# Patient Record
Sex: Male | Born: 1960
Health system: Southern US, Community
[De-identification: ages and names within clinical notes are randomized; demographics above are authoritative.]

## PROBLEM LIST (undated history)

## (undated) DIAGNOSIS — B019 Varicella without complication: Secondary | ICD-10-CM

## (undated) DIAGNOSIS — R011 Cardiac murmur, unspecified: Secondary | ICD-10-CM

## (undated) HISTORY — PX: CARPAL TUNNEL RELEASE: SHX101

## (undated) HISTORY — PX: TONSILLECTOMY: SUR1361

## (undated) HISTORY — DX: Cardiac murmur, unspecified: R01.1

## (undated) HISTORY — DX: Varicella without complication: B01.9

---

## 2012-09-14 ENCOUNTER — Encounter: Payer: Self-pay | Admitting: Family Medicine

## 2012-09-14 ENCOUNTER — Ambulatory Visit (INDEPENDENT_AMBULATORY_CARE_PROVIDER_SITE_OTHER): Payer: BC Managed Care – PPO | Admitting: Family Medicine

## 2012-09-14 VITALS — BP 160/84 | HR 72 | Temp 98.0°F | Resp 12 | Ht 68.0 in | Wt 209.0 lb

## 2012-09-14 DIAGNOSIS — R030 Elevated blood-pressure reading, without diagnosis of hypertension: Secondary | ICD-10-CM

## 2012-09-14 DIAGNOSIS — M542 Cervicalgia: Secondary | ICD-10-CM

## 2012-09-14 DIAGNOSIS — IMO0001 Reserved for inherently not codable concepts without codable children: Secondary | ICD-10-CM

## 2012-09-14 DIAGNOSIS — Z299 Encounter for prophylactic measures, unspecified: Secondary | ICD-10-CM

## 2012-09-14 MED ORDER — HYDROCODONE-ACETAMINOPHEN 5-325 MG PO TABS: ORAL_TABLET | ORAL | Status: DC

## 2012-09-14 MED ORDER — PREDNISONE 10 MG PO TABS: ORAL_TABLET | ORAL | Status: DC

## 2012-09-14 MED ORDER — TETANUS-DIPHTH-ACELL PERTUSSIS 5-2.5-18.5 LF-MCG/0.5 IM SUSP: 0.5000 mL | Freq: Once | INTRAMUSCULAR | Status: DC

## 2012-09-14 NOTE — Progress Notes (Signed)
  Subjective:    Patient ID: Jim Moran, male    DOB: [birth year], 51 y.o.   MRN: [id number]  HPI  Patient new to establish. No chronic medical problem. Reportedly had heart murmur in childhood but none as an adult. He takes no medications. No surgeries. No known drug allergies.  Family history reviewed. Father had leukemia.Also had type 2 diabetes.   Patient is single. Works as an Personnel officer in Occupational hygienist. Quit smoking about 3 years ago. Occasional alcohol use.  Cervical neck pain. 2 and one half week duration. No injury. Started with burning type sensation radiating toward right shoulder blade. Sharp quality. Moderate to severe at times. Aleve without relief. Heat with mild relief. Denies any upper extremity numbness or weakness. No radiculopathy symptoms. Used Flexeril with minimal if any improvement. Denies any cough or dyspnea.   Review of Systems  Constitutional: Negative for fever, chills and unexpected weight change.  HENT: Positive for neck pain and neck stiffness.   Respiratory: Negative for cough and shortness of breath.   Gastrointestinal: Negative for abdominal pain.  Neurological: Negative for weakness and numbness.       Objective:   Physical Exam  Constitutional: He appears well-developed and well-nourished.  Neck: Neck supple. No thyromegaly present.  Cardiovascular: Normal rate and regular rhythm.   Pulmonary/Chest: Effort normal and breath sounds normal. No respiratory distress. He has no wheezes. He has no rales.  Musculoskeletal: He exhibits no edema.       Good distal upper extremity pulses. Cervical neck range of motion is slightly restricted with lateral bending and rotation to the right and left side. No spinal tenderness. Full range of motion both shoulders  Neurological:       No upper extremity weakness. No numbness. Symmetric upper extremity reflexes          Assessment & Plan:  #1 cervical neck pain. Nonfocal neuro exam. Continue heat.  Prednisone taper. Limited hydrocodone for nighttime use for severe pain. Consider imaging in 2-3 weeks if no better #2 elevated blood pressure. No history of hypertension. Monitor for next couple weeks and reassess in 3 weeks time.

## 2012-09-23 ENCOUNTER — Other Ambulatory Visit: Payer: Self-pay | Admitting: Family Medicine

## 2012-09-23 NOTE — Telephone Encounter (Signed)
New pt 10/2, 3 week follow-up scheduled 10/28 10/2 pt placed on prednisone taper.

## 2012-09-23 NOTE — Telephone Encounter (Signed)
May refill once only.  If persist after this go round of prednisone we'll need followup to consider further imaging

## 2012-09-24 NOTE — Telephone Encounter (Signed)
Electronic request for refill of prednisone, reviewing Dr Lucie Leather note, I spoke with pt and he stated "the pain returned as soon as I came off the prednisone".  Pt has F/U visit on 10/28, together we decided to use OTC Aleve or Tylenol rather than another round of the steroid.

## 2012-10-10 ENCOUNTER — Ambulatory Visit: Payer: BC Managed Care – PPO | Admitting: Family Medicine

## 2014-02-05 ENCOUNTER — Telehealth: Payer: Self-pay | Admitting: Family Medicine

## 2014-02-05 NOTE — Telephone Encounter (Signed)
Yes

## 2014-02-05 NOTE — Telephone Encounter (Signed)
Pt would like md to accept his wife as new pt. Can I sch?

## 2014-02-09 NOTE — Telephone Encounter (Signed)
Pt wife will callback on Monday to sch new pt appt

## 2015-01-22 ENCOUNTER — Telehealth: Payer: Self-pay | Admitting: Family Medicine

## 2015-01-22 NOTE — Telephone Encounter (Signed)
Patient Name: Jim Moran  DOB: 03/06/1961    Initial Comment Caller states husband c/o intermittant chest pain, heaviness in chest all the time   Nurse Assessment  Nurse: Sherilyn CooterHenry, RN, Thurmond ButtsWade Date/Time Lamount Cohen(Eastern Time): 01/22/2015 9:02:51 AM  Confirm and document reason for call. If symptomatic, describe symptoms. ---Caller states that her husband has had some intermittent chest pain that began last week. He has a heavy pressure of his chest. He is not with her at present. She is only calling to make an appointment. I advised her that with these symptoms, we recommend calling 911. She states that it is not serious enough for him to call 911. She is only wanting to make an appointment for him to be seen. From the Message of the day info, I advised her that I will forward this information to the office and someone will get back with her. She verbalized understanding.  Has the patient traveled out of the country within the last 30 days? ---Not Applicable  Does the patient require triage? ---No     Guidelines    Guideline Title Affirmed Question Affirmed Notes       Final Disposition User

## 2015-01-22 NOTE — Telephone Encounter (Signed)
Per Dr. Leonard SchwartzB. Pt needs to go to the hospital. Informed patient caller. Patient Caller still wants appointment to be seen. Informed patient caller that the patient needs to go to the hospital and the patient can make appointment after he leaves the hospital. Caller stated that she will see what the patient wants to do.

## 2015-02-01 ENCOUNTER — Ambulatory Visit
Admission: RE | Admit: 2015-02-01 | Discharge: 2015-02-01 | Disposition: A | Discharge status: Home / Self Care | Payer: BLUE CROSS/BLUE SHIELD | Source: Ambulatory Visit | Attending: Family Medicine | Admitting: Family Medicine

## 2015-02-01 ENCOUNTER — Other Ambulatory Visit: Payer: Self-pay | Admitting: Family Medicine

## 2015-02-01 DIAGNOSIS — R0789 Other chest pain: Secondary | ICD-10-CM

## 2019-06-15 ENCOUNTER — Emergency Department (HOSPITAL_COMMUNITY): Payer: BC Managed Care – PPO

## 2019-06-15 ENCOUNTER — Other Ambulatory Visit: Payer: Self-pay

## 2019-06-15 ENCOUNTER — Emergency Department (HOSPITAL_COMMUNITY)
Admission: EM | Admit: 2019-06-15 | Discharge: 2019-06-15 | Disposition: A | Discharge status: Home / Self Care | Payer: BC Managed Care – PPO | Attending: Emergency Medicine | Admitting: Emergency Medicine

## 2019-06-15 DIAGNOSIS — Z87891 Personal history of nicotine dependence: Secondary | ICD-10-CM | POA: Diagnosis not present

## 2019-06-15 DIAGNOSIS — E871 Hypo-osmolality and hyponatremia: Secondary | ICD-10-CM | POA: Insufficient documentation

## 2019-06-15 DIAGNOSIS — N179 Acute kidney failure, unspecified: Secondary | ICD-10-CM | POA: Diagnosis not present

## 2019-06-15 DIAGNOSIS — R002 Palpitations: Secondary | ICD-10-CM | POA: Insufficient documentation

## 2019-06-15 DIAGNOSIS — I10 Essential (primary) hypertension: Secondary | ICD-10-CM | POA: Diagnosis not present

## 2019-06-15 DIAGNOSIS — E860 Dehydration: Secondary | ICD-10-CM | POA: Insufficient documentation

## 2019-06-15 DIAGNOSIS — R0789 Other chest pain: Secondary | ICD-10-CM | POA: Diagnosis not present

## 2019-06-15 DIAGNOSIS — E876 Hypokalemia: Secondary | ICD-10-CM

## 2019-06-15 DIAGNOSIS — R42 Dizziness and giddiness: Secondary | ICD-10-CM | POA: Diagnosis present

## 2019-06-15 LAB — CBC
HCT: 40.7 % (ref 39.0–52.0)
Hemoglobin: 14 g/dL (ref 13.0–17.0)
MCH: 32.9 pg (ref 26.0–34.0)
MCHC: 34.4 g/dL (ref 30.0–36.0)
MCV: 95.5 fL (ref 80.0–100.0)
Platelets: 145 10*3/uL — ABNORMAL LOW (ref 150–400)
RBC: 4.26 MIL/uL (ref 4.22–5.81)
RDW: 11.9 % (ref 11.5–15.5)
WBC: 8.7 10*3/uL (ref 4.0–10.5)
nRBC: 0 % (ref 0.0–0.2)

## 2019-06-15 LAB — BASIC METABOLIC PANEL
Anion gap: 15 (ref 5–15)
BUN: 19 mg/dL (ref 6–20)
CO2: 18 mmol/L — ABNORMAL LOW (ref 22–32)
Calcium: 9.6 mg/dL (ref 8.9–10.3)
Chloride: 94 mmol/L — ABNORMAL LOW (ref 98–111)
Creatinine, Ser: 1.67 mg/dL — ABNORMAL HIGH (ref 0.61–1.24)
GFR calc Af Amer: 53 mL/min — ABNORMAL LOW (ref >60)
GFR calc non Af Amer: 45 mL/min — ABNORMAL LOW (ref >60)
Glucose, Bld: 100 mg/dL — ABNORMAL HIGH (ref 70–99)
Potassium: 3.4 mmol/L — ABNORMAL LOW (ref 3.5–5.1)
Sodium: 127 mmol/L — ABNORMAL LOW (ref 135–145)

## 2019-06-15 LAB — TROPONIN I (HIGH SENSITIVITY)
Troponin I (High Sensitivity): 10 ng/L (ref <18)
Troponin I (High Sensitivity): 10 ng/L (ref <18)

## 2019-06-15 MED ORDER — LACTATED RINGERS IV BOLUS
1000.0000 mL | Freq: Once | INTRAVENOUS | Status: AC
  Administered 2019-06-15: 1000 mL via INTRAVENOUS

## 2019-06-15 MED ORDER — SODIUM CHLORIDE 0.9 % IV BOLUS
1000.0000 mL | Freq: Once | INTRAVENOUS | Status: AC
  Administered 2019-06-15: 03:00:00 1000 mL via INTRAVENOUS

## 2019-06-15 MED ORDER — SODIUM CHLORIDE 0.9% FLUSH: 3.0000 mL | Freq: Once | INTRAVENOUS | Status: DC

## 2019-06-15 NOTE — ED Triage Notes (Signed)
C/o excessive sweating yesterday while at work; followed by pain in shoulder and back. Pt c/o chest pain and dizziness as well. Pt reported hx of HTN; denies sz hx.

## 2019-06-15 NOTE — ED Notes (Signed)
ED Provider at bedside. 

## 2019-06-15 NOTE — ED Provider Notes (Signed)
Emergency Department Provider Note   I have reviewed the triage vital signs and the nursing notes.   HISTORY  Chief Complaint Chest Pain and Dizziness   HPI Jim Moran is a 58 y.o. male with history of hypertension on lisinopril who presents the emergency department today secondary to multiple complaints.  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  Today had multiple episodes of lightheadedness especially with standing up.  But tonight when he went home he started have some palpitations with some chest burning which was unlike anything else he had.  He drinks more fluids and a couple beers and tried to rest.  He woke up with the melanite and sat up and felt really dizzy that he was in a pass out and had the same palpitation so presents here for further evaluation.  At this time patient does not have any other symptoms.  Has not urinated earlier today has had significant decreased urine throughout the day.  No fever, vomiting but has had some nausea.  One episode of diarrhea today.  No rashes.  No mental status changes.   No other associated or modifying symptoms.    Past Medical History:  Diagnosis Date  . Chicken pox   . Heart murmur     There are no active problems to display for this patient.   No past surgical history on file.  Current Outpatient Rx  . Order #: [id number] Class: Historical Med  . Order #: [id number] Class: Print  . Order #: [id number] Class: Historical Med  . Order #: [id number] Class: Normal    Allergies Patient has no known allergies.  Family History  Problem Relation Age of Onset  . Leukemia Father 23  . Diabetes Father   . Cancer Father        leukemia    Social History Social History   Tobacco Use  . Smoking status: Former Smoker    Packs/day: 0.50    Years: 20.00    Pack years: 10.00    Types:  Cigarettes    Quit date: 12/15/2008    Years since quitting: 10.5  Substance Use Topics  . Alcohol use: Not on file  . Drug use: Not on file    Review of Systems  All other systems negative except as documented in the HPI. All pertinent positives and negatives as reviewed in the HPI. ____________________________________________   PHYSICAL EXAM:  VITAL SIGNS: ED Triage Vitals [06/15/19 0149]  Enc Vitals Group     BP 140/78     Pulse Rate 74     Resp 16     Temp 97.8 F (36.6 C)     Temp Source Oral     SpO2 100 %     Weight 180 lb (81.6 kg)     Height 5\' 8"  (1.727 m)     Head Circumference      Peak Flow      Pain Score 4     Pain Loc      Pain Edu?      Excl. in [place]?     Constitutional: Alert and oriented. Well appearing and in no acute distress. Eyes: Conjunctivae are normal. PERRL. EOMI. Head: Atraumatic. Nose: No congestion/rhinnorhea. Mouth/Throat: Mucous membranes are moist.  Oropharynx non-erythematous. Neck: No stridor.  No meningeal signs.   Cardiovascular: Normal rate, regular rhythm. Good peripheral  circulation. Grossly normal heart sounds.   Respiratory: Normal respiratory effort.  No retractions. Lungs CTAB. Gastrointestinal: Soft and nontender. No distention.  Musculoskeletal: No lower extremity tenderness nor edema. No gross deformities of extremities. Neurologic:  Normal speech and language. No gross focal neurologic deficits are appreciated.  Skin:  Skin is warm, dry and intact. No rash noted.   ____________________________________________   LABS (all labs ordered are listed, but only abnormal results are displayed)  Labs Reviewed  BASIC METABOLIC PANEL - Abnormal; Notable for the following components:      Result Value   Sodium 127 (*)    Potassium 3.4 (*)    Chloride 94 (*)    CO2 18 (*)    Glucose, Bld 100 (*)    Creatinine, Ser 1.67 (*)    GFR calc non Af Amer 45 (*)    GFR calc Af Amer 53 (*)    All other components within normal  limits  CBC - Abnormal; Notable for the following components:   Platelets 145 (*)    All other components within normal limits  TROPONIN I (HIGH SENSITIVITY)  TROPONIN I (HIGH SENSITIVITY)   ____________________________________________  EKG   EKG Interpretation  Date/Time:    Ventricular Rate:    PR Interval:    QRS Duration:   QT Interval:    QTC Calculation:   R Axis:     Text Interpretation:         ____________________________________________  RADIOLOGY  No results found.  ____________________________________________   PROCEDURES  Procedure(s) performed:   Procedures   ____________________________________________   INITIAL IMPRESSION / ASSESSMENT AND PLAN / ED COURSE  Suspect dehydration/electrolyte abnormality as cause for symptoms. Will hydrate, check labs, etc.   Labs with e/o hyponatremia, hypokalemia and AKI. Given one liter of LR and one of NaCl with improvement in symptoms and will likely help correct his lytes as well. Urinated multiple times here.doubt ongoing renal failure or rhabdo. Stable for dc w/ recheck in one week.      Pertinent labs & imaging results that were available during my care of the patient were reviewed by me and considered in my medical decision making (see chart for details).   A medical screening exam was performed and I feel the patient has had an appropriate workup for their chief complaint at this time and likelihood of emergent condition existing is low. They have been counseled on decision, discharge, follow up and which symptoms necessitate immediate return to the emergency department. They or their family verbally stated understanding and agreement with plan and discharged in stable condition.   ____________________________________________  FINAL CLINICAL IMPRESSION(S) / ED DIAGNOSES  Final diagnoses:  Dehydration  Hyponatremia  Hypokalemia  AKI (acute kidney injury) (HCC)     MEDICATIONS GIVEN DURING THIS  VISIT:  Medications  lactated ringers bolus 1,000 mL (0 mLs Intravenous Stopped 06/15/19 0429)  sodium chloride 0.9 % bolus 1,000 mL (0 mLs Intravenous Stopped 06/15/19 0429)     NEW OUTPATIENT MEDICATIONS STARTED DURING THIS VISIT:  Discharge Medication List as of 06/15/2019  4:36 AM      Note:  This note was prepared with assistance of Dragon voice recognition software. Occasional wrong-word or sound-a-like substitutions may have occurred due to the inherent limitations of voice recognition software.   Abdulkarim Eberlin, Barbara CowerJason, MD 06/16/19 [phone]

## 2019-11-16 DIAGNOSIS — Z20828 Contact with and (suspected) exposure to other viral communicable diseases: Secondary | ICD-10-CM | POA: Diagnosis not present

## 2020-07-15 DIAGNOSIS — Z125 Encounter for screening for malignant neoplasm of prostate: Secondary | ICD-10-CM | POA: Diagnosis not present

## 2020-07-15 DIAGNOSIS — Z13220 Encounter for screening for lipoid disorders: Secondary | ICD-10-CM | POA: Diagnosis not present

## 2020-07-15 DIAGNOSIS — M549 Dorsalgia, unspecified: Secondary | ICD-10-CM | POA: Diagnosis not present

## 2020-07-15 DIAGNOSIS — R7309 Other abnormal glucose: Secondary | ICD-10-CM | POA: Diagnosis not present

## 2020-07-15 DIAGNOSIS — D699 Hemorrhagic condition, unspecified: Secondary | ICD-10-CM | POA: Diagnosis not present

## 2020-07-15 DIAGNOSIS — I10 Essential (primary) hypertension: Secondary | ICD-10-CM | POA: Diagnosis not present

## 2020-10-29 DIAGNOSIS — I10 Essential (primary) hypertension: Secondary | ICD-10-CM | POA: Diagnosis present

## 2020-10-29 DIAGNOSIS — M5416 Radiculopathy, lumbar region: Secondary | ICD-10-CM | POA: Diagnosis not present

## 2020-11-15 DIAGNOSIS — M5116 Intervertebral disc disorders with radiculopathy, lumbar region: Secondary | ICD-10-CM | POA: Diagnosis not present

## 2022-03-30 ENCOUNTER — Other Ambulatory Visit: Payer: Self-pay

## 2022-03-30 ENCOUNTER — Encounter (HOSPITAL_BASED_OUTPATIENT_CLINIC_OR_DEPARTMENT_OTHER): Payer: Self-pay

## 2022-03-30 ENCOUNTER — Inpatient Hospital Stay (HOSPITAL_BASED_OUTPATIENT_CLINIC_OR_DEPARTMENT_OTHER)
Admission: EM | Admit: 2022-03-30 | Discharge: 2022-04-07 | DRG: 391 | Disposition: A | Discharge status: Home / Self Care | Payer: BC Managed Care – PPO | Attending: Family Medicine | Admitting: Family Medicine

## 2022-03-30 ENCOUNTER — Emergency Department (HOSPITAL_BASED_OUTPATIENT_CLINIC_OR_DEPARTMENT_OTHER): Payer: BC Managed Care – PPO

## 2022-03-30 DIAGNOSIS — N179 Acute kidney failure, unspecified: Secondary | ICD-10-CM | POA: Diagnosis present

## 2022-03-30 DIAGNOSIS — Z79899 Other long term (current) drug therapy: Secondary | ICD-10-CM | POA: Diagnosis not present

## 2022-03-30 DIAGNOSIS — K5792 Diverticulitis of intestine, part unspecified, without perforation or abscess without bleeding: Secondary | ICD-10-CM | POA: Diagnosis present

## 2022-03-30 DIAGNOSIS — K651 Peritoneal abscess: Secondary | ICD-10-CM | POA: Diagnosis present

## 2022-03-30 DIAGNOSIS — I10 Essential (primary) hypertension: Secondary | ICD-10-CM | POA: Diagnosis not present

## 2022-03-30 DIAGNOSIS — K921 Melena: Secondary | ICD-10-CM | POA: Insufficient documentation

## 2022-03-30 DIAGNOSIS — Z86010 Personal history of colonic polyps: Secondary | ICD-10-CM | POA: Diagnosis not present

## 2022-03-30 DIAGNOSIS — K4090 Unilateral inguinal hernia, without obstruction or gangrene, not specified as recurrent: Secondary | ICD-10-CM | POA: Diagnosis present

## 2022-03-30 DIAGNOSIS — K567 Ileus, unspecified: Secondary | ICD-10-CM | POA: Diagnosis present

## 2022-03-30 DIAGNOSIS — R6510 Systemic inflammatory response syndrome (SIRS) of non-infectious origin without acute organ dysfunction: Secondary | ICD-10-CM | POA: Diagnosis present

## 2022-03-30 DIAGNOSIS — G8929 Other chronic pain: Secondary | ICD-10-CM | POA: Diagnosis present

## 2022-03-30 DIAGNOSIS — A419 Sepsis, unspecified organism: Secondary | ICD-10-CM

## 2022-03-30 DIAGNOSIS — Z20822 Contact with and (suspected) exposure to covid-19: Secondary | ICD-10-CM | POA: Diagnosis present

## 2022-03-30 DIAGNOSIS — Z7982 Long term (current) use of aspirin: Secondary | ICD-10-CM | POA: Diagnosis not present

## 2022-03-30 DIAGNOSIS — K5900 Constipation, unspecified: Secondary | ICD-10-CM | POA: Diagnosis present

## 2022-03-30 DIAGNOSIS — Z87891 Personal history of nicotine dependence: Secondary | ICD-10-CM

## 2022-03-30 DIAGNOSIS — Z885 Allergy status to narcotic agent status: Secondary | ICD-10-CM | POA: Diagnosis not present

## 2022-03-30 DIAGNOSIS — D72829 Elevated white blood cell count, unspecified: Secondary | ICD-10-CM

## 2022-03-30 DIAGNOSIS — K5720 Diverticulitis of large intestine with perforation and abscess without bleeding: Principal | ICD-10-CM

## 2022-03-30 DIAGNOSIS — Z860101 Personal history of adenomatous and serrated colon polyps: Secondary | ICD-10-CM

## 2022-03-30 LAB — CBC
HCT: 41 % (ref 39.0–52.0)
Hemoglobin: 13.9 g/dL (ref 13.0–17.0)
MCH: 31.9 pg (ref 26.0–34.0)
MCHC: 33.9 g/dL (ref 30.0–36.0)
MCV: 94 fL (ref 80.0–100.0)
Platelets: 275 10*3/uL (ref 150–400)
RBC: 4.36 MIL/uL (ref 4.22–5.81)
RDW: 11.6 % (ref 11.5–15.5)
WBC: 15.6 10*3/uL — ABNORMAL HIGH (ref 4.0–10.5)
nRBC: 0 % (ref 0.0–0.2)

## 2022-03-30 LAB — COMPREHENSIVE METABOLIC PANEL
ALT: 16 U/L (ref 0–44)
AST: 17 U/L (ref 15–41)
Albumin: 4.6 g/dL (ref 3.5–5.0)
Alkaline Phosphatase: 73 U/L (ref 38–126)
Anion gap: 12 (ref 5–15)
BUN: 14 mg/dL (ref 6–20)
CO2: 25 mmol/L (ref 22–32)
Calcium: 10.2 mg/dL (ref 8.9–10.3)
Chloride: 95 mmol/L — ABNORMAL LOW (ref 98–111)
Creatinine, Ser: 1.02 mg/dL (ref 0.61–1.24)
GFR, Estimated: >60 mL/min (ref >60)
Glucose, Bld: 113 mg/dL — ABNORMAL HIGH (ref 70–99)
Potassium: 3.9 mmol/L (ref 3.5–5.1)
Sodium: 132 mmol/L — ABNORMAL LOW (ref 135–145)
Total Bilirubin: 0.5 mg/dL (ref 0.3–1.2)
Total Protein: 8.3 g/dL — ABNORMAL HIGH (ref 6.5–8.1)

## 2022-03-30 LAB — URINALYSIS, ROUTINE W REFLEX MICROSCOPIC
Bilirubin Urine: NEGATIVE
Glucose, UA: NEGATIVE mg/dL
Ketones, ur: 40 mg/dL — AB
Leukocytes,Ua: NEGATIVE
Nitrite: NEGATIVE
Protein, ur: 30 mg/dL — AB
Specific Gravity, Urine: 1.027 (ref 1.005–1.030)
pH: 6 (ref 5.0–8.0)

## 2022-03-30 LAB — RESP PANEL BY RT-PCR (FLU A&B, COVID) ARPGX2
Influenza A by PCR: NEGATIVE
Influenza B by PCR: NEGATIVE
SARS Coronavirus 2 by RT PCR: NEGATIVE

## 2022-03-30 LAB — PROTIME-INR
INR: 1 (ref 0.8–1.2)
Prothrombin Time: 13.2 s (ref 11.4–15.2)

## 2022-03-30 LAB — LIPASE, BLOOD: Lipase: <10 U/L — ABNORMAL LOW (ref 11–51)

## 2022-03-30 LAB — LACTIC ACID, PLASMA
Lactic Acid, Venous: 0.6 mmol/L (ref 0.5–1.9)
Lactic Acid, Venous: 0.9 mmol/L (ref 0.5–1.9)

## 2022-03-30 LAB — APTT: aPTT: 35 s (ref 24–36)

## 2022-03-30 MED ORDER — PIPERACILLIN-TAZOBACTAM 3.375 G IVPB
3.3750 g | Freq: Three times a day (TID) | INTRAVENOUS | Status: DC
  Administered 2022-03-30 – 2022-04-03 (×11): 3.375 g via INTRAVENOUS
  Filled 2022-03-30 (×11): qty 50

## 2022-03-30 MED ORDER — PROCHLORPERAZINE EDISYLATE 10 MG/2ML IJ SOLN: 5.0000 mg | INTRAMUSCULAR | Status: DC | PRN

## 2022-03-30 MED ORDER — IOHEXOL 300 MG/ML  SOLN
100.0000 mL | Freq: Once | INTRAMUSCULAR | Status: AC | PRN
  Administered 2022-03-30: 85 mL via INTRAVENOUS

## 2022-03-30 MED ORDER — ENOXAPARIN SODIUM 40 MG/0.4ML IJ SOSY
40.0000 mg | PREFILLED_SYRINGE | INTRAMUSCULAR | Status: DC
  Administered 2022-03-30 – 2022-04-02 (×4): 40 mg via SUBCUTANEOUS
  Filled 2022-03-30 (×4): qty 0.4

## 2022-03-30 MED ORDER — DIPHENHYDRAMINE HCL 50 MG/ML IJ SOLN: 12.5000 mg | Freq: Four times a day (QID) | INTRAMUSCULAR | Status: DC | PRN

## 2022-03-30 MED ORDER — ONDANSETRON HCL 4 MG/2ML IJ SOLN
4.0000 mg | Freq: Once | INTRAMUSCULAR | Status: AC
  Administered 2022-03-30: 4 mg via INTRAVENOUS
  Filled 2022-03-30: qty 2

## 2022-03-30 MED ORDER — LIP MEDEX EX OINT
1.0000 | TOPICAL_OINTMENT | Freq: Two times a day (BID) | CUTANEOUS | Status: DC
Start: 2022-03-30 — End: 2022-04-07
  Administered 2022-03-30 – 2022-04-07 (×16): 1 via TOPICAL
  Filled 2022-03-30 (×2): qty 7

## 2022-03-30 MED ORDER — OXYCODONE HCL 5 MG PO TABS: 5.0000 mg | ORAL_TABLET | ORAL | Status: DC | PRN

## 2022-03-30 MED ORDER — LACTATED RINGERS IV BOLUS
1000.0000 mL | Freq: Once | INTRAVENOUS | Status: AC
  Administered 2022-03-30: 1000 mL via INTRAVENOUS

## 2022-03-30 MED ORDER — METHOCARBAMOL 500 MG PO TABS
1000.0000 mg | ORAL_TABLET | Freq: Four times a day (QID) | ORAL | Status: DC | PRN
  Administered 2022-04-01: 1000 mg via ORAL
  Filled 2022-03-30: qty 2

## 2022-03-30 MED ORDER — KETOROLAC TROMETHAMINE 15 MG/ML IJ SOLN
15.0000 mg | Freq: Once | INTRAMUSCULAR | Status: AC
  Administered 2022-03-30: 15 mg via INTRAVENOUS
  Filled 2022-03-30: qty 1

## 2022-03-30 MED ORDER — MORPHINE SULFATE (PF) 4 MG/ML IV SOLN
4.0000 mg | Freq: Once | INTRAVENOUS | Status: AC
  Administered 2022-03-30: 4 mg via INTRAVENOUS
  Filled 2022-03-30: qty 1

## 2022-03-30 MED ORDER — HYDROMORPHONE HCL 1 MG/ML IJ SOLN
1.0000 mg | INTRAMUSCULAR | Status: DC | PRN
  Administered 2022-03-30 – 2022-04-01 (×9): 1 mg via INTRAVENOUS
  Filled 2022-03-30 (×10): qty 1

## 2022-03-30 MED ORDER — ACETAMINOPHEN 650 MG RE SUPP: 650.0000 mg | Freq: Four times a day (QID) | RECTAL | Status: DC | PRN

## 2022-03-30 MED ORDER — LACTATED RINGERS IV SOLN
INTRAVENOUS | Status: DC
Start: 2022-03-30 — End: 2022-04-01

## 2022-03-30 MED ORDER — HYDROMORPHONE HCL 1 MG/ML IJ SOLN
1.0000 mg | Freq: Once | INTRAMUSCULAR | Status: AC
  Administered 2022-03-30: 1 mg via INTRAVENOUS
  Filled 2022-03-30: qty 1

## 2022-03-30 MED ORDER — ACETAMINOPHEN 325 MG PO TABS: 650.0000 mg | ORAL_TABLET | Freq: Four times a day (QID) | ORAL | Status: DC | PRN

## 2022-03-30 MED ORDER — ACETAMINOPHEN 500 MG PO TABS: 1000.0000 mg | ORAL_TABLET | Freq: Four times a day (QID) | ORAL | Status: DC

## 2022-03-30 MED ORDER — ONDANSETRON HCL 4 MG/2ML IJ SOLN
4.0000 mg | Freq: Four times a day (QID) | INTRAMUSCULAR | Status: DC | PRN
Start: 2022-03-30 — End: 2022-04-07

## 2022-03-30 MED ORDER — LACTATED RINGERS IV SOLN: INTRAVENOUS | Status: DC

## 2022-03-30 MED ORDER — HYDROMORPHONE HCL 1 MG/ML IJ SOLN: 0.5000 mg | INTRAMUSCULAR | Status: DC | PRN

## 2022-03-30 MED ORDER — ALUM & MAG HYDROXIDE-SIMETH 200-200-20 MG/5ML PO SUSP: 30.0000 mL | Freq: Four times a day (QID) | ORAL | Status: DC | PRN

## 2022-03-30 MED ORDER — LACTATED RINGERS IV BOLUS: 1000.0000 mL | Freq: Three times a day (TID) | INTRAVENOUS | Status: DC | PRN

## 2022-03-30 MED ORDER — SODIUM CHLORIDE 0.9 % IV BOLUS
1000.0000 mL | Freq: Once | INTRAVENOUS | Status: AC
  Administered 2022-03-30: 1000 mL via INTRAVENOUS

## 2022-03-30 MED ORDER — METOPROLOL TARTRATE 5 MG/5ML IV SOLN: 5.0000 mg | Freq: Four times a day (QID) | INTRAVENOUS | Status: DC | PRN

## 2022-03-30 MED ORDER — SODIUM CHLORIDE 0.9 % IV SOLN
8.0000 mg | Freq: Four times a day (QID) | INTRAVENOUS | Status: DC | PRN
  Filled 2022-03-30: qty 4

## 2022-03-30 MED ORDER — PHENOL 1.4 % MT LIQD: 2.0000 | OROMUCOSAL | Status: DC | PRN

## 2022-03-30 MED ORDER — MENTHOL 3 MG MT LOZG: 1.0000 | LOZENGE | OROMUCOSAL | Status: DC | PRN

## 2022-03-30 MED ORDER — MAGIC MOUTHWASH
15.0000 mL | Freq: Four times a day (QID) | ORAL | Status: DC | PRN
  Filled 2022-03-30: qty 15

## 2022-03-30 MED ORDER — SIMETHICONE 40 MG/0.6ML PO SUSP
80.0000 mg | Freq: Four times a day (QID) | ORAL | Status: DC | PRN
  Filled 2022-03-30: qty 1.2

## 2022-03-30 MED ORDER — PIPERACILLIN-TAZOBACTAM 3.375 G IVPB 30 MIN
3.3750 g | Freq: Once | INTRAVENOUS | Status: AC
  Administered 2022-03-30: 3.375 g via INTRAVENOUS
  Filled 2022-03-30: qty 50

## 2022-03-30 MED ORDER — DEXTROSE 5 % IV SOLN
1000.0000 mg | Freq: Four times a day (QID) | INTRAVENOUS | Status: DC | PRN
  Filled 2022-03-30: qty 10

## 2022-03-30 NOTE — ED Provider Notes (Signed)
?[facility] EMERGENC[facility] DEPT ?Provider Note ? ? ?CSN: [id number] ?Arrival date & time: 03/30/22  1057 ? ?  ? ?History ? ?Chief Complaint  ?Patient presents with  ? Abdominal Pain  ? ? ?Jim Moran is a 61 y.o. male. ? ? Patient as above with significant medical history as below, including chicken pox, heart murmur who presents to the ED with complaint of abd pain. ? ?Location:  LLQ to RLQ ?Duration:  1 wk ?Onset:  gradual ?Timing:  intermittent, now constant ?Description:  sharp, stabbing, "like a hot knife" ?Severity:  moderate ?Exacerbating/Alleviating Factors:  worse with PO intake, improved after having bm ?Associated Symptoms:  poor po, nausea w/o emesis, constipation, some scant blood in stool, positive chills ?Pertinent Negatives:  no fevers, cp or dib, no rashes, no trauma ?Context: 1 week of lower quad abdominal pain.  Started left lower quadrant and spread to the right lower quadrant.  Mildly improved after using laxatives/enema for his constipation. ? ? ? ?Past Medical History: ?No date: Chicken pox ?No date: Heart murmur ? ?History reviewed. No pertinent surgical history.  ? ? ?The history is provided by the patient. No language interpreter was used.  ?Abdominal Pain ?Associated symptoms: chills, constipation and nausea   ?Associated symptoms: no chest pain, no cough, no fever, no hematuria, no shortness of breath and no vomiting   ? ?  ? ?Home Medications ?Prior to Admission medications   ?Medication Sig Start Date End Date Taking? Authorizing Provider  ?aspirin 81 MG tablet Take 81 mg by mouth daily.    [provider]  ?HYDROcodone-acetaminophen (NORCO/VICODIN) 5-325 MG per tablet 1-2 tablets every 4-6 hours prn pain 09/14/12   Burchette, Elberta Fortis, MD  ?Multiple Vitamins-Minerals (MENS MULTI VITAMIN & MINERAL PO) Take by mouth daily.    [provider]  ?predniSONE (DELTASONE) 10 MG tablet 6-6-4-4-3-3-2-1 09/14/12   Burchette, Elberta Fortis, MD  ?   ? ?Allergies    ?Patient has  no known allergies.   ? ?Review of Systems   ?Review of Systems  ?Constitutional:  Positive for appetite change and chills. Negative for fever.  ?HENT:  Negative for facial swelling and trouble swallowing.   ?Eyes:  Negative for photophobia and visual disturbance.  ?Respiratory:  Negative for cough and shortness of breath.   ?Cardiovascular:  Negative for chest pain and palpitations.  ?Gastrointestinal:  Positive for abdominal pain, constipation and nausea. Negative for vomiting.  ?Endocrine: Negative for polydipsia and polyuria.  ?Genitourinary:  Negative for difficulty urinating and hematuria.  ?Musculoskeletal:  Negative for gait problem and joint swelling.  ?Skin:  Negative for pallor and rash.  ?Neurological:  Negative for syncope and headaches.  ?Psychiatric/Behavioral:  Negative for agitation and confusion.   ? ?Physical Exam ?Updated Vital Signs ?BP 111/68   Pulse 73   Temp 99 ?F (37.2 ?C) (Oral)   Resp 20   Ht 5\' 8"  (1.727 m)   Wt 81.6 kg   SpO2 96%   BMI 27.35 kg/m?  ?Physical Exam ?Vitals and nursing note reviewed.  ?Constitutional:   ?   General: He is not in acute distress. ?   Appearance: He is well-developed. He is not diaphoretic.  ?HENT:  ?   Head: Normocephalic and atraumatic.  ?   Right Ear: External ear normal.  ?   Left Ear: External ear normal.  ?   Mouth/Throat:  ?   Mouth: Mucous membranes are moist.  ?Eyes:  ?   General: No scleral icterus. ?Cardiovascular:  ?  Rate and Rhythm: Normal rate and regular rhythm.  ?   Pulses: Normal pulses.  ?   Heart sounds: Normal heart sounds.  ?Pulmonary:  ?   Effort: Pulmonary effort is normal. No respiratory distress.  ?   Breath sounds: Normal breath sounds.  ?Abdominal:  ?   General: Abdomen is flat.  ?   Palpations: Abdomen is soft.  ?   Tenderness: There is abdominal tenderness in the suprapubic area and left lower quadrant. There is no guarding or rebound.  ?   Comments: Not peritoneal  ?Musculoskeletal:     ?   General: Normal range of  motion.  ?   Cervical back: Normal range of motion.  ?   Right lower leg: No edema.  ?   Left lower leg: No edema.  ?Skin: ?   General: Skin is warm and dry.  ?   Capillary Refill: Capillary refill takes less than 2 seconds.  ?Neurological:  ?   Mental Status: He is alert and oriented to person, place, and time.  ?   GCS: GCS eye subscore is 4. GCS verbal subscore is 5. GCS motor subscore is 6.  ?Psychiatric:     ?   Mood and Affect: Mood normal.     ?   Behavior: Behavior normal.  ? ? ?ED Results / Procedures / Treatments   ?Labs ?(all labs ordered are listed, but only abnormal results are displayed) ?Labs Reviewed  ?LIPASE, BLOOD - Abnormal; Notable for the following components:  ?    Result Value  ? Lipase <10 (*)   ? All other components within normal limits  ?COMPREHENSIVE METABOLIC PANEL - Abnormal; Notable for the following components:  ? Sodium 132 (*)   ? Chloride 95 (*)   ? Glucose, Bld 113 (*)   ? Total Protein 8.3 (*)   ? All other components within normal limits  ?CBC - Abnormal; Notable for the following components:  ? WBC 15.6 (*)   ? All other components within normal limits  ?URINALYSIS, ROUTINE W REFLEX MICROSCOPIC - Abnormal; Notable for the following components:  ? Hgb urine dipstick LARGE (*)   ? Ketones, ur 40 (*)   ? Protein, ur 30 (*)   ? All other components within normal limits  ?CULTURE, BLOOD (ROUTINE X 2)  ?CULTURE, BLOOD (ROUTINE X 2)  ?RESP PANEL BY RT-PCR (FLU A&B, COVID) ARPGX2  ?URINE CULTURE  ?PROTIME-INR  ?APTT  ?LACTIC ACID, PLASMA  ?LACTIC ACID, PLASMA  ? ? ?EKG ?EKG Interpretation ? ?Date/Time:  Monday March 30 2022 14:57:56 EDT ?Ventricular Rate:  73 ?PR Interval:  185 ?QRS Duration: 98 ?QT Interval:  409 ?QTC Calculation: 451 ?R Axis:   0 ?Text Interpretation: Sinus rhythm Confirmed by Tanda RockersGray, Judas Mohammad ([id number]) on 03/30/2022 3:53:37 PM ? ?Radiology ?CT ABDOMEN PELVIS W CONTRAST ? ?Result Date: 03/30/2022 ?CLINICAL DATA:  Abdominal pain. LEFT lower quadrant pain radiating to the RIGHT  side. EXAM: CT ABDOMEN AND PELVIS WITH CONTRAST TECHNIQUE: Multidetector CT imaging of the abdomen and pelvis was performed using the standard protocol following bolus administration of intravenous contrast. RADIATION DOSE REDUCTION: This exam was performed according to the departmental dose-optimization program which includes automated exposure control, adjustment of the mA and/or kV according to patient size and/or use of iterative reconstruction technique. CONTRAST:  85mL OMNIPAQUE IOHEXOL 300 MG/ML  SOLN COMPARISON:  None. FINDINGS: Lower chest: No acute abnormality. Hepatobiliary: No focal liver abnormality is seen. No gallstones, gallbladder wall thickening, or biliary dilatation. Pancreas:  Unremarkable. No pancreatic ductal dilatation or surrounding inflammatory changes. Spleen: No acute findings. Chronic/dystrophic calcifications within the upper spleen. Adrenals/Urinary Tract: Adrenal glands appear normal. Kidneys are unremarkable without mass, stone or hydronephrosis. No perinephric fluid or inflammation. Bladder is decompressed. Walls of the upper bladder appear thickened. Stomach/Bowel: Prominent thickening of the walls of a 10 cm segment of the mid sigmoid colon, with extensive surrounding pericolonic inflammation/fluid stranding. Irregular hypoechoic collection along the LEFT lateral wall of the affected portion of the sigmoid colon measures 2.8 x 1.2 cm, suspicious for early abscess within the wall of the sigmoid colon. More proximal portion of the large bowel is normal in caliber and without additional site of bowel wall inflammation. Appendix is normal. Small bowel is normal in caliber. Stomach is unremarkable, partially decompressed. Vascular/Lymphatic: Aortic atherosclerosis. No acute-appearing vascular abnormality. No enlarged lymph nodes are seen. Reproductive: Prostate is unremarkable. Other: Small amount of free fluid layering within the pelvis. No free intraperitoneal air. Musculoskeletal: No  acute-appearing osseous abnormality. Mild degenerative spondylosis of the thoracic and lumbar spine. RIGHT inguinal hernia which contains fat only. IMPRESSION: 1. Acute diverticulitis of the sigmoid colon. [facility]

## 2022-03-30 NOTE — Progress Notes (Signed)
61 year old with history of essential hypertension, chronic pain presented to droppage ED with complaints of lower abdominal pain, fevers and chills.  Upon work-up he was found to be septic secondary to acute complicated sigmoid diverticulitis.  Case was discussed by ER provider with general surgery who recommended admitting the patient on IV antibiotics.  Patient has been started on Zosyn. ? ?CT abdomen pelvis-acute sigmoid diverticulitis with 2.8X 1.2 cm small abscess within the left lateral wall of sigmoid colon. ? ?Patient accepted to MedSurg floor at [facility]. ? ?Stephania Fragmin MD ?

## 2022-03-30 NOTE — ED Notes (Signed)
Report given to [facility]. 

## 2022-03-30 NOTE — H&P (Addendum)
? ?                                                                           TRH H&P ? ? ? Patient Demographics:  ? ? Jim Moran, is a 61 y.o. male  MRN: [id number]  DOB - 05/15/1961 ? ?Admit Date - 03/30/2022 ? ? ?Outpatient Primary MD for the patient is Roderick Peelmore, Kevin A, [place] ? ?[facility] from: [facility] ? ?Chief complaint-abdominal pain ? ? HPI:  ? ? Jim Moran  is a 61 y.o. male, with history of hypertension, presented with 6-day history of abdominal pain.  Patient states that he started having left lower quadrant pain on last Tuesday after he was constipated, he tried over-the-counter Ex-Lax with some improvement however the pain returned.  Pain is located in left lower quadrant with radiation to right lower quadrant,.Pain became worse today so he came to hospital for further evaluation.  Patient endorses fever and chills intermittently, denies nausea and vomiting.  In the ED CT scan of the abdomen and pelvis showed sigmoid diverticulitis with 2.8 x 1.2 cm small abscess within the left lateral wall of sigmoid colon. ?General surgery was consulted and recommended IV antibiotics at least for 5 days. ?He denies chest pain or shortness of breath ?Denies dysuria ?No nausea vomiting or diarrhea ? ? ? ? Review of systems:  ?  ?In addition to the HPI above,  ?. ? ?All other systems reviewed and are negative. ? ? ? Past History of the following :  ? ? ?Past Medical History:  ?Diagnosis Date  ? Chicken pox   ? Heart murmur   ?   ? ?History reviewed. No pertinent surgical history. ? ? ? Social History:  ? ? ?  ?Social History  ? ?Tobacco Use  ? Smoking status: Former  ?  Packs/day: 0.50  ?  Years: 20.00  ?  Pack years: 10.00  ?  Types: Cigarettes  ?  Quit date: 12/15/2008  ?  Years since quitting: 13.2  ? Smokeless tobacco: Not on file  ?Substance Use Topics  ? Alcohol use: Yes  ?  Comment: Occasionally  ?  ? ? ? Family History :  ? ?  ?Family History  ?Problem Relation Age of Onset  ? Leukemia Father  4570  ? Diabetes Father   ? Cancer Father   ?     leukemia  ? ? ? ? Home Medications:  ? ?Prior to Admission medications   ?Medication Sig Start Date End Date Taking? Authorizing Provider  ?aspirin 81 MG tablet Take 81 mg by mouth daily.    [provider]  ?HYDROcodone-acetaminophen (NORCO/VICODIN) 5-325 MG per tablet 1-2 tablets every 4-6 hours prn pain 09/14/12   Burchette, Elberta FortisBruce W, MD  ?Multiple Vitamins-Minerals (MENS MULTI VITAMIN & MINERAL PO) Take by mouth daily.    [provider]  ?predniSONE (DELTASONE) 10 MG tablet 6-6-4-4-3-3-2-1 09/14/12   Burchette, Elberta FortisBruce W, MD  ? ? ? Allergies:  ? ? No Known Allergies ? ? Physical Exam:  ? ?Vitals ? ?Blood pressure (!) 108/59, pulse 65, temperature 99 ?F (37.2 ?C), temperature source Oral, resp. rate 15, height 5\' 8"  (1.727 m), weight 81.6  kg, SpO2 96 %. ? ?1.  General: ?Appears in no acute distress ? ?2. Psychiatric: ?Alert, oriented x3, intact insight and judgment ? ?3. Neurologic: ?Cranial nerves II through XII grossly intact, no focal deficit noted 8 ? ?4. HEENMT:  ?Atraumatic normocephalic, extraocular muscles are intact ? ?5. Respiratory : ?Clear to auscultation bilaterally ? ?6. Cardiovascular : ?S1-S2, regular, no murmur auscultated, no edema in the lower extremities ? ?7. Gastrointestinal:  ?Abdomen is soft, mild tenderness in left lower quadrant, no rigidity or guarding ? ? ? ? ? Data Review:  ? ? CBC ?Recent Labs  ?Lab 03/30/22 ?1119  ?WBC 15.6*  ?HGB 13.9  ?HCT 41.0  ?PLT 275  ?MCV 94.0  ?MCH 31.9  ?MCHC 33.9  ?RDW 11.6  ? ?------------------------------------------------------------------------------------------------------------------ ? ?Results for orders placed or performed during the hospital encounter of 03/30/22 (from the past 48 hour(s))  ?Protime-INR     Status: None  ? Collection Time: 03/30/22 11:16 AM  ?Result Value Ref Range  ? Prothrombin Time 13.2 11.4 - 15.2 seconds  ? INR 1.0 0.8 - 1.2  ?  Comment: (NOTE) ?INR goal varies  based on device and disease states. ?Performed at [facility], [address], ?[place], [place] [address] ?  ?APTT     Status: None  ? Collection Time: 03/30/22 11:16 AM  ?Result Value Ref Range  ? aPTT 35 24 - 36 seconds  ?  Comment: Performed at [facility], [address] [place], [place] [address]  ?Lipase, blood     Status: Abnormal  ? Collection Time: 03/30/22 11:19 AM  ?Result Value Ref Range  ? Lipase <10 (L) 11 - 51 U/L  ?  Comment: Performed at [facility], [address] [place], [place] [address]  ?Comprehensive metabolic panel     Status: Abnormal  ? Collection Time: 03/30/22 11:19 AM  ?Result Value Ref Range  ? Sodium 132 (L) 135 - 145 mmol/L  ? Potassium 3.9 3.5 - 5.1 mmol/L  ? Chloride 95 (L) 98 - 111 mmol/L  ? CO2 25 22 - 32 mmol/L  ? Glucose, Bld 113 (H) 70 - 99 mg/dL  ?  Comment: Glucose reference range applies only to samples taken after fasting for at least 8 hours.  ? BUN 14 6 - 20 mg/dL  ? Creatinine, Ser 1.02 0.61 - 1.24 mg/dL  ? Calcium 10.2 8.9 - 10.3 mg/dL  ? Total Protein 8.3 (H) 6.5 - 8.1 g/dL  ? Albumin 4.6 3.5 - 5.0 g/dL  ? AST 17 15 - 41 U/L  ? ALT 16 0 - 44 U/L  ? Alkaline Phosphatase 73 38 - 126 U/L  ? Total Bilirubin 0.5 0.3 - 1.2 mg/dL  ? GFR, Estimated >60 >60 mL/min  ?  Comment: (NOTE) ?Calculated using the CKD-EPI Creatinine Equation (2021) ?  ? Anion gap 12 5 - 15  ?  Comment: Performed at [facility], [address] [place], [place] [address]  ?CBC     Status: Abnormal  ? Collection Time: 03/30/22 11:19 AM  ?Result Value Ref Range  ? WBC 15.6 (H) 4.0 - 10.5 K/uL  ? RBC 4.36 4.22 - 5.81 MIL/uL  ? Hemoglobin 13.9 13.0 - 17.0 g/dL  ? HCT 41.0 39.0 - 52.0 %  ? MCV 94.0 80.0 - 100.0 fL  ? MCH 31.9 26.0 - 34.0 pg  ? MCHC 33.9 30.0 - 36.0 g/dL  ? RDW 11.6 11.5 - 15.5 %  ? Platelets 275 150 -  400 K/uL  ? nRBC 0.0 0.0 - 0.2 %  ?  Comment: Performed at [facility], [address] [place], [place] [address]  ?Urinalysis, Routine w reflex microscopic Urine, Clean Catch     Status: Abnormal  ? Collection Time: 03/30/22 11:19 AM  ?Result Value Ref Range  ? Color, Urine YELLOW YELLOW  ? APPearance CLEAR CLEAR  ? Specific Gravity, Urine 1.027 1.005 - 1.030  ? pH 6.0 5.0 - 8.0  ? Glucose, UA NEGATIVE NEGATIVE mg/dL  ? Hgb urine dipstick LARGE (A) NEGATIVE  ? Bilirubin Urine NEGATIVE NEGATIVE  ? Ketones, ur 40 (A) NEGATIVE mg/dL  ? Protein, ur 30 (A) NEGATIVE mg/dL  ? Nitrite NEGATIVE NEGATIVE  ? Leukocytes,Ua NEGATIVE NEGATIVE  ? RBC / HPF 11-20 0 - 5 RBC/hpf  ? WBC, UA 0-5 0 - 5 WBC/hpf  ? Mucus PRESENT   ?  Comment: Performed at [facility], [address] [place], [place] [address]  ?Lactic acid, plasma     Status: None  ? Collection Time: 03/30/22  2:49 PM  ?Result Value Ref Range  ? Lactic Acid, Venous 0.6 0.5 - 1.9 mmol/L  ?  Comment: Performed at [facility], [address], [place], [place] [address]  ?Resp Panel by RT-PCR (Flu A&B, Covid) Nasopharyngeal Swab     Status: None  ? Collection Time: 03/30/22  2:49 PM  ? Specimen: Nasopharyngeal Swab; Nasopharyngeal(NP) swabs in vial transport medium  ?Result Value Ref Range  ? SARS Coronavirus 2 by RT PCR NEGATIVE NEGATIVE  ?  Comment: (NOTE) ?SARS-CoV-2 target nucleic acids are NOT DETECTED. ? ?The SARS-CoV-2 RNA is generally detectable in upper respiratory ?specimens during the acute phase of infection. The lowest ?concentration of SARS-CoV-2 viral copies this assay can detect is ?138 copies/mL. A negative result does not preclude SARS-Cov-2 ?infection and should not be used as the sole basis for treatment or ?other patient management decisions. A negative result may occur with  ?improper specimen collection/handling, submission of specimen other ?than nasopharyngeal swab, presence of viral mutation(s) within the ?areas targeted by this assay, and inadequate number of viral ?copies(<138  copies/mL). A negative result must be combined with ?clinical observations, patient history, and epidemiological ?information. The expected result is Negative. ? ?Fact Sheet for Patients:  ?[email or web address].at

## 2022-03-30 NOTE — ED Notes (Signed)
Report given to [facility] RN @ [facility] ?

## 2022-03-30 NOTE — ED Notes (Signed)
RT Note: 12-Lead EKG obtained/labelled/given to MD. 

## 2022-03-30 NOTE — ED Triage Notes (Signed)
Patient here POV from Home. ? ?Endorses ABD Pain to LLQ radiating to RLQ and Bilateral Flank. Pain present for approximately 1 Week. ? ?Diarrhea the Week prior and used Imodium for Relief. Then became Constipated and used Laxative on Thursday with Mild Relief. ? ?Also endorses Urinary Frequency. No N/V.  ? ?NAD Noted during Triage. A&Ox4. GCS 15. Ambulatory. ?

## 2022-03-30 NOTE — Progress Notes (Signed)
Pharmacy Antibiotic Note ? ?Jim Moran is a 61 y.o. male for which pharmacy has been consulted for zosyn dosing for  intra-abdominal coverage . ? ?SCr 1.02 ?WBC 15.6; T 99 F ? ?Plan: ?Zosyn 3.375g IV q8h (4 hour infusion) ?Trend WBC, Fever, Renal function, & Clinical course ?F/u cultures, clinical course, WBC, fever ?De-escalate when able ? ?Height: 5\' 8"  (172.7 cm) ?Weight: 81.6 kg (179 lb 14.3 oz) ?IBW/kg (Calculated) : 68.4 ? ?Temp (24hrs), Avg:98.3 ?F (36.8 ?C), Min:98.3 ?F (36.8 ?C), Max:98.3 ?F (36.8 ?C) ? ?Recent Labs  ?Lab 03/30/22 ?1119  ?WBC 15.6*  ?CREATININE 1.02  ?  ?Estimated Creatinine Clearance: 76.4 mL/min (by C-G formula based on SCr of 1.02 mg/dL).   ? ?No Known Allergies ? ?Antimicrobials this admission: ?zosyn 4/17 >>  ? ?Microbiology results: ?Pending ? ?Thank you for allowing pharmacy to be a part of this patient?s care. ? ?5/17, PharmD, BCPS ?03/30/2022 2:33 PM ?ED Clinical Pharmacist -  [phone] ?  ?

## 2022-03-30 NOTE — Sepsis Progress Note (Signed)
eLink monitoring code sepsis.  

## 2022-03-30 NOTE — Consult Note (Signed)
? ? ? ?Jim Moran  ?1961-01-25 ?[id number] ? ?CARE TEAM: ? ?PCP: Roderick Pee, PA ? ?Outpatient Care Team: Patient Care Team: ?Roderick Pee, PA as PCP - General (Family Medicine) ?Farris Has, MD as Referring Physician (Family Medicine) ?Vida Rigger, MD as Consulting Physician (Gastroenterology) ? ?Inpatient Treatment Team: Treatment Team: Attending Provider: [name], MD; Consulting Physician: Montez Morita Md, MD; Registered Nurse: Edison Simon, RN; Rounding Team: Arlean Hopping, MD ? ? ?This patient is a 61 y.o.male who presents today for surgical evaluation at the request of Dr Wallace Cullens, [facility] ED.  ? ?Chief complaint / Reason for evaluation: Diverticulitis ? ?Relatively healthy male has had worsening abdominal pain for the past week.  Persistent.  Some crampiness.  Symptomatic chills.  Decreasing appetite.  Came to [facility] emergency department.  Examination concerning.  CT scan showing significant inflammation of sigmoid colon with phlegmon and probable abscess suspicious for diverticulitis.  Recommendation made for hospitalization.  Patient transferred to [facility] against due to bed availability although initially discussed with the [facility] surgical team.  Request for surgery to help follow. ? ?Patient notes his pain is in better control.  He does not recall ever having any diverticulitis before.  He has had a colonoscopy by [facility].  Looking through the records Jim Moran name is in there about 10 years ago.  Patient recalls numerous polyps removed.  I think he delayed follow-up endoscopy when COVID hit.  Patient moves his bowels twice a day.  He has not smoked in over a decade.  He works as Personnel officer.  No prior abdominal surgery.  No history of inflammatory bowel disease or Crohn's or irritable bowel syndrome.  No difficulty with activity ? ? ?Assessment  ?Jim Moran  61 y.o. male  ?    ? ?Problem List: ? ?Principal Problem: ?  Abscess of sigmoid colon due  to diverticulitis ?Active Problems: ?  Hypertension, essential ?  Diverticulitis of sigmoid colon with abscess ?  History of adenomatous polyp of colon ? ? ?Patient with persistent abdominal pain and inflamed sigmoid colon with phlegmon and possible abscess.  Consistent with diverticulitis ? ?Plan: ? ?Agree with hospitalization ? ?IV antibiotics.  Would do Zosyn x5 days minimum. ? ?IVFluid resuscitation. ? ?Nausea and pain control ? ?Surgery will help follow. ? ?If clinically improves, can transition to advancing on oral diet and discharge with oral antibiotics  ? ?Given that this is a complex attack, reasonable to consider elective colectomy in the future given complex attack.  Since this is the only attack he is ever had, do not know if I feel strongly about that at first evaluation.. ? ?It would definitely be helpful to get copy of last colonoscopy through [facility].  He is overdue for colonoscopy.  He notes his wife has been trying to get him to get one done anyway.  Suspect it would be wise for the patient to get a colonoscopy in about 6-8 weeks to see what is going on endoluminally and rule out any tumor or other concern.  ? ?If does not improve repeat CAT scan work out progressive abscess requiring drainage or if more persistent or severe decline, may require Hartmann resection (sigmoid colectomy/colostomy).  Hopefully not as likely.  We will see. ? ?-VTE prophylaxis- SCDs, etc ?-mobilize as tolerated to help recovery ? ?I reviewed ED provider notes, last 24 h vitals and pain scores, last 48 h intake and output, last 24 h labs and trends, and last  24 h imaging results. I have reviewed this patient's available data, including medical history, events of note, test results, etc as part of my evaluation.  A significant portion of that time was spent in counseling.  Care during the described time interval was provided by me. ? ?This care required moderate level of medical decision making.   03/30/2022 ? ?Ardeth SportsmanSteven C. Bitania Shankland, MD, FACS, MASCRS ?Esophageal, Gastrointestinal & Colorectal Surgery ?Robotic and Minimally Invasive Surgery ? ?[facility]  [facility]  ?[address] ?[place], [place] [id number] ?[phone] Fax ?[phone] Main ? ?CONTACT INFORMATION: ? ?Weekday (9AM-5PM): Call [facility] main office at [phone] ? ?Weeknight (5PM-9AM) or Weekend/Holiday: Check [email or web address] (password " [email or web address]") for General Surgery [facility] coverage ? ?(Please, do not use SecureChat as it is not reliable communication to operating surgeons for immediate patient care) ?  ? ? ? ?03/30/2022 ? ? ? ? ? ?Past Medical History:  ?Diagnosis Date  ? Chicken pox   ? Heart murmur   ? ? ?History reviewed. No pertinent surgical history. ? ?Social History  ? ?Socioeconomic History  ? Marital status: Married  ?  Spouse name: Not on file  ? Number of children: Not on file  ? Years of education: Not on file  ? Highest education level: Not on file  ?Occupational History  ? Not on file  ?Tobacco Use  ? Smoking status: Former  ?  Packs/day: 0.50  ?  Years: 20.00  ?  Pack years: 10.00  ?  Types: Cigarettes  ?  Quit date: 12/15/2008  ?  Years since quitting: 13.2  ? Smokeless tobacco: Not on file  ?Substance and Sexual Activity  ? Alcohol use: Yes  ?  Comment: Occasionally  ? Drug use: Never  ? Sexual activity: Not on file  ?Other Topics Concern  ? Not on file  ?Social History Narrative  ? Not on file  ? ?Social Determinants of Health  ? ?Financial Resource Strain: Not on file  ?Food Insecurity: Not on file  ?Transportation Needs: Not on file  ?Physical Activity: Not on file  ?Stress: Not on file  ?Social Connections: Not on file  ?Intimate Partner Violence: Not on file  ? ? ?Family History  ?Problem Relation Age of Onset  ? Leukemia Father 6670  ? Diabetes Father   ? Cancer Father   ?     leukemia  ? ? ?Current Facility-Administered Medications  ?Medication Dose Route Frequency Provider  Last Rate Last Admin  ? acetaminophen (TYLENOL) tablet 1,000 mg  1,000 mg Oral Q6H Karie SodaGross, Tayen Narang, MD      ? alum & mag hydroxide-simeth (MAALOX/MYLANTA) 200-200-20 MG/5ML suspension 30 mL  30 mL Oral Q6H PRN Karie SodaGross, Roddy Bellamy, MD      ? diphenhydrAMINE (BENADRYL) injection 12.5-25 mg  12.5-25 mg Intravenous Q6H PRN Karie SodaGross, Mecca Guitron, MD      ? HYDROmorphone (DILAUDID) injection 0.5-2 mg  0.5-2 mg Intravenous Q2H PRN Karie SodaGross, Hartman Minahan, MD      ? lactated ringers bolus 1,000 mL  1,000 mL Intravenous Once Karie SodaGross, Tarvares Lant, MD      ? lactated ringers bolus 1,000 mL  1,000 mL Intravenous Q8H PRN Karie SodaGross, Myrakle Wingler, MD      ? lactated ringers infusion   Intravenous Continuous Tanda RockersGray, Samuel A, DO 150 mL/hr at 03/30/22 1546 New Bag at 03/30/22 1546  ? lip balm (CARMEX) ointment 1 application.  1 application. Topical BID Karie SodaGross, Lacharles Altschuler, MD   1 application. at 03/30/22  1716  ? magic mouthwash  15 mL Oral QID PRN Karie Soda, MD      ? menthol-cetylpyridinium (CEPACOL) lozenge 3 mg  1 lozenge Oral PRN Karie Soda, MD      ? methocarbamol (ROBAXIN) 1,000 mg in dextrose 5 % 100 mL IVPB  1,000 mg Intravenous Q6H PRN Karie Soda, MD      ? methocarbamol (ROBAXIN) tablet 1,000 mg  1,000 mg Oral Q6H PRN Karie Soda, MD      ? metoprolol tartrate (LOPRESSOR) injection 5 mg  5 mg Intravenous Q6H PRN Karie Soda, MD      ? ondansetron (ZOFRAN) injection 4 mg  4 mg Intravenous Q6H PRN Karie Soda, MD      ? Or  ? ondansetron (ZOFRAN) 8 mg in sodium chloride 0.9 % 50 mL IVPB  8 mg Intravenous Q6H PRN Karie Soda, MD      ? oxyCODONE (Oxy IR/ROXICODONE) immediate release tablet 5-10 mg  5-10 mg Oral Q4H PRN Karie Soda, MD      ? phenol (CHLORASEPTIC) mouth spray 2 spray  2 spray Mouth/Throat PRN Karie Soda, MD      ? piperacillin-tazobactam (ZOSYN) IVPB 3.375 g  3.375 g Intravenous Trixie Deis, MD      ? prochlorperazine (COMPAZINE) injection 5-10 mg  5-10 mg Intravenous Q4H PRN Karie Soda, MD      ? simethicone (MYLICON) 40  MG/0.6ML suspension 80 mg  80 mg Oral QID PRN Karie Soda, MD      ?  ? ?No Known Allergies ? ?ROS:   All other systems reviewed & are negative except per HPI or as noted below: ?Constitutional:  No fevers, chills, sweats.

## 2022-03-31 DIAGNOSIS — K5720 Diverticulitis of large intestine with perforation and abscess without bleeding: Secondary | ICD-10-CM | POA: Diagnosis not present

## 2022-03-31 DIAGNOSIS — I10 Essential (primary) hypertension: Secondary | ICD-10-CM | POA: Diagnosis not present

## 2022-03-31 LAB — COMPREHENSIVE METABOLIC PANEL
ALT: 15 U/L (ref 0–44)
AST: 16 U/L (ref 15–41)
Albumin: 3.5 g/dL (ref 3.5–5.0)
Alkaline Phosphatase: 62 U/L (ref 38–126)
Anion gap: 7 (ref 5–15)
BUN: 14 mg/dL (ref 6–20)
CO2: 28 mmol/L (ref 22–32)
Calcium: 8.8 mg/dL — ABNORMAL LOW (ref 8.9–10.3)
Chloride: 100 mmol/L (ref 98–111)
Creatinine, Ser: 0.96 mg/dL (ref 0.61–1.24)
GFR, Estimated: >60 mL/min (ref >60)
Glucose, Bld: 101 mg/dL — ABNORMAL HIGH (ref 70–99)
Potassium: 4.1 mmol/L (ref 3.5–5.1)
Sodium: 135 mmol/L (ref 135–145)
Total Bilirubin: 0.3 mg/dL (ref 0.3–1.2)
Total Protein: 7 g/dL (ref 6.5–8.1)

## 2022-03-31 LAB — CBC
HCT: 36.9 % — ABNORMAL LOW (ref 39.0–52.0)
Hemoglobin: 12.4 g/dL — ABNORMAL LOW (ref 13.0–17.0)
MCH: 32.9 pg (ref 26.0–34.0)
MCHC: 33.6 g/dL (ref 30.0–36.0)
MCV: 97.9 fL (ref 80.0–100.0)
Platelets: 252 10*3/uL (ref 150–400)
RBC: 3.77 MIL/uL — ABNORMAL LOW (ref 4.22–5.81)
RDW: 11.8 % (ref 11.5–15.5)
WBC: 13.2 10*3/uL — ABNORMAL HIGH (ref 4.0–10.5)
nRBC: 0 % (ref 0.0–0.2)

## 2022-03-31 LAB — HIV ANTIBODY (ROUTINE TESTING W REFLEX): HIV Screen 4th Generation wRfx: NONREACTIVE

## 2022-03-31 LAB — URINE CULTURE: Culture: NO GROWTH

## 2022-03-31 MED ORDER — LACTATED RINGERS IV BOLUS: 1000.0000 mL | Freq: Three times a day (TID) | INTRAVENOUS | Status: AC | PRN

## 2022-03-31 MED ORDER — HYDRALAZINE HCL 25 MG PO TABS: 25.0000 mg | ORAL_TABLET | Freq: Four times a day (QID) | ORAL | Status: DC | PRN

## 2022-03-31 NOTE — Progress Notes (Signed)
? ?Jim Moran ?[id number] ?16-May-1961 ? ?CARE TEAM: ? ?PCP: Roderick Pee, PA ? ?Outpatient Care Team: Patient Care Team: ?Roderick Pee, PA as PCP - General (Family Medicine) ?Farris Has, MD as Referring Physician (Family Medicine) ?Vida Rigger, MD as Consulting Physician (Gastroenterology) ? ?Inpatient Treatment Team: Treatment Team: Attending Provider: [name], MD; Consulting Physician: Montez Morita Md, MD; Registered Nurse: Edison Simon, RN; Rounding Team: Delmer Islam, MD; Charge Nurse: Saddie Benders, RN; Pharmacist: Lynden Ang, [facility] ? ? ?Problem List:  ? ?Principal Problem: ?  Abscess of sigmoid colon due to diverticulitis ?Active Problems: ?  Hypertension, essential ?  Diverticulitis of sigmoid colon with abscess ?  History of adenomatous polyp of colon ?  Diverticulitis ? ? ?   * No surgery found * ? ? ? ? ? ?Assessment ? ?Sigmoid diverticulitis with phlegmon and small abscess slowly improve ? ?(Hospital Stay = 1 days) ? ?Plan: ? ?-Given the fact that his leukocytosis is gone down and pain is less, reasonable to do a oral challenge.  Start with clear liquids.  If tolerates that well may be can gradually advance diet and discharge in a few days.  We will see.  If feels worse, go back to n.p.o./ice chips ?-Continue IV antibiotics.  Given his initial significant leukocytosis with evidence of probable abscess to piperacillin/tazobactam for protocol ?-As discussed yesterday, patient will benefit from interval colonoscopy in 6-8 weeks from this attack to rule out any malignancy and he is overdue given his numerous polyps. ?-Can consider elective resection since this is a rather complicated attack and the patient is only 60.  However it is a gray area.  Let see if we can get him through this acute episode and see if this can be discussed as an outpatient electively.  Getting colonoscopy information would be helpful as well ?-VTE prophylaxis- SCDs, etc ?-mobilize as tolerated to help  recovery ? ?Disposition:  ?Disposition:  ?The patient is from: Home ? ?Anticipate discharge to:  Home ? ?Anticipated Date of Discharge is:  April 20,2023 ?  ? ?Barriers to discharge:  Pending Clinical improvement (more likely than not) ? ?Patient currently is NOT MEDICALLY STABLE for discharge from the hospital from a surgery standpoint. ? ? ? ? ? ?I reviewed nursing notes, hospitalist notes, last 24 h vitals and pain scores, last 48 h intake and output, last 24 h labs and trends, and last 24 h imaging results. I have reviewed this patient's available data, including medical history, events of note, test results, etc as part of my evaluation.  A significant portion of that time was spent in counseling.  Care during the described time interval was provided by me. ? ?This care required moderate level of medical decision making.  03/31/2022 ? ? ? ?Subjective: ?(Chief complaint) ? ?Patient denies any nausea or vomiting.  Somewhat hungry. ? ?Still with moderate soreness in left lower abdomen.  Slightly improved. ? ?Starting to mobilize more. ? ?Objective: ? ?Vital signs: ? ?Vitals:  ? 03/30/22 1742 03/30/22 2213 03/31/22 0228 03/31/22 9628  ?BP: (!) 108/59 115/72 120/76 (!) 116/58  ?Pulse: 65 69 76 82  ?Resp: 15 17 17 16   ?Temp:  98.9 ?F (37.2 ?C) 99.3 ?F (37.4 ?C) 99.1 ?F (37.3 ?C)  ?TempSrc:  Oral Oral Oral  ?SpO2: 96% 96% 92% 93%  ?Weight:      ?Height:      ? ? ?Last BM Date : 03/26/22 ? ?Intake/Output  ? ?Yesterday: ? 04/17 0701 -  04/18 0700 ?In: 2259 [P.O.:120; I.V.:1063.2; IV Piggyback:1075.9] ?Out: 400 [Urine:400] ?This shift: ? No intake/output data recorded. ? ?Bowel function: ? Flatus: YES ? BM:  No ? Drain: (No drain) ? ? ?Physical Exam: ? ?General: Pt awake/alert in no acute distress ?Eyes: PERRL, normal EOM.  Sclera clear.  No icterus ?Neuro: CN II-XII intact w/o focal sensory/motor deficits. ?Lymph: No head/neck/groin lymphadenopathy ?Psych:  No delerium/psychosis/paranoia.  Oriented x 4 ?HENT:  Normocephalic, Mucus membranes moist.  No thrush ?Neck: Supple, No tracheal deviation.  No obvious thyromegaly ?Chest: No pain to chest wall compression.  Good respiratory excursion.  No audible wheezing ?CV:  Pulses intact.  Regular rhythm.  No major extremity edema ?MS: Normal AROM mjr joints.  No obvious deformity ? ?Abdomen: Soft.  Nondistended.  Tenderness at LLQ only .  No evidence of peritonitis.  No incarcerated hernias. ? ?Ext:   No deformity.  No mjr edema.  No cyanosis ?Skin: No petechiae / purpurea.  No major sores.  Warm and dry ? ? ? ?Results:  ? ?Cultures: ?Recent Results (from the past 720 hour(s))  ?Resp Panel by RT-PCR (Flu A&B, Covid) Nasopharyngeal Swab     Status: None  ? Collection Time: 03/30/22  2:49 PM  ? Specimen: Nasopharyngeal Swab; Nasopharyngeal(NP) swabs in vial transport medium  ?Result Value Ref Range Status  ? SARS Coronavirus 2 by RT PCR NEGATIVE NEGATIVE Final  ?  Comment: (NOTE) ?SARS-CoV-2 target nucleic acids are NOT DETECTED. ? ?The SARS-CoV-2 RNA is generally detectable in upper respiratory ?specimens during the acute phase of infection. The lowest ?concentration of SARS-CoV-2 viral copies this assay can detect is ?138 copies/mL. A negative result does not preclude SARS-Cov-2 ?infection and should not be used as the sole basis for treatment or ?other patient management decisions. A negative result may occur with  ?improper specimen collection/handling, submission of specimen other ?than nasopharyngeal swab, presence of viral mutation(s) within the ?areas targeted by this assay, and inadequate number of viral ?copies(<138 copies/mL). A negative result must be combined with ?clinical observations, patient history, and epidemiological ?information. The expected result is Negative. ? ?Fact Sheet for Patients:  ?BloggerCourse.com ? ?Fact Sheet for Healthcare Providers:  ?SeriousBroker.it ? ?This test is no t yet approved or cleared  by the [place] FDA and  ?has been authorized for detection and/or diagnosis of SARS-CoV-2 by ?FDA under an Emergency Use Authorization (EUA). This EUA will remain  ?in effect (meaning this test can be used) for the duration of the ?COVID-19 declaration under Section 564(b)(1) of the Act, 21 ?U.S.C.section 360bbb-3(b)(1), unless the authorization is terminated  ?or revoked sooner.  ? ? ?  ? Influenza A by PCR NEGATIVE NEGATIVE Final  ? Influenza B by PCR NEGATIVE NEGATIVE Final  ?  Comment: (NOTE) ?The Xpert Xpress SARS-CoV-2/FLU/RSV plus assay is intended as an aid ?in the diagnosis of influenza from Nasopharyngeal swab specimens and ?should not be used as a sole basis for treatment. Nasal washings and ?aspirates are unacceptable for Xpert Xpress SARS-CoV-2/FLU/RSV ?testing. ? ?Fact Sheet for Patients: ?BloggerCourse.com ? ?Fact Sheet for Healthcare Providers: ?SeriousBroker.it ? ?This test is not yet approved or cleared by the [place] FDA and ?has been authorized for detection and/or diagnosis of SARS-CoV-2 by ?FDA under an Emergency Use Authorization (EUA). This EUA will remain ?in effect (meaning this test can be used) for the duration of the ?COVID-19 declaration under Section 564(b)(1) of the Act, 21 U.S.C. ?section 360bbb-3(b)(1), unless the authorization is terminated or ?revoked. ? ?  Performed at [facility] [place], [address] [place], [place] [address] ?  ?Blood culture (routine x 2)     Status: None (Preliminary result)  ? Collection Time: 03/30/22  3:05 PM  ? Specimen: BLOOD  ?Result Value Ref Range Status  ? Specimen Description   Final  ?  BLOOD LEFT ANTECUBITAL ?Performed at [facility], [address], [place], [place] [address] ?  ? Special Requests   Final  ?  AEROBIC BOTTLE ONLY Blood Culture adequate volume ?Performed at [facility], [address] [place], [place] [address] ?  ? Culture PENDING   Incomplete  ? Report Status PENDING  Incomplete  ? ? ?Labs: ?Results for orders placed or performed during the hospital encounter of 03/30/22 (from the past 48 hour(s))  ?Protime-INR     Status: None  ? Collection Time: 03/30/22

## 2022-03-31 NOTE — Progress Notes (Signed)
[facility] ? ?PROGRESS NOTE ? ?Jim Moran KKX:[id number] DOB: 1961/04/18 DOA: 03/30/2022 ?PCP: Jim Pee, PA ? ? ?Brief HPI:   ?61 year old male with history of hypertension presents with 6-day history of abdominal pain, pain was located in left lower quadrant.  Also complained of constipation.  CT scan abdomen pelvis showed sigmoid diverticulitis with 2.8 x 1.2 cm small abscess within the left lateral wall of the sigmoid colon.  General surgery was consulted for IV antibiotics.  Patient started on IV Zosyn. ? ? ?Subjective  ? ?Patient seen and examined, still has left lower quadrant pain. ? ? Assessment/Plan:  ? ? ?Acute complicated sigmoid diverticulitis ?-CT scan abdomen pelvis shows sigmoid diverticulitis with abscess 2.8 x 1.2 cm in the lateral wall of sigmoid colon ?-Patient started on IV Zosyn ?-General surgery has started on clear liquid diet ?-He will need colonoscopy in 6 to 8 weeks to rule out malignancy ? ?Hypertension ?-Blood pressure is intermittently elevated ?-Not on medications at home ?-We will start hydralazine as needed ? ?History of colon polyps ?-Patient had a colonoscopy 10 years ago ?-He will need to follow-up with gastroenterology as outpatient ? ? ?Medications ? ?  ? enoxaparin (LOVENOX) injection  40 mg Subcutaneous Q24H  ? lip balm  1 application. Topical BID  ? ? ? Data Reviewed:  ? ?CBG: ? ?No results for input(s): GLUCAP in the last 168 hours. ? ?SpO2: 95 %  ? ? ?Vitals:  ? 03/30/22 2213 03/31/22 0228 03/31/22 0628 03/31/22 1225  ?BP: 115/72 120/76 (!) 116/58 (!) 153/94  ?Pulse: 69 76 82 76  ?Resp: 17 17 16 16   ?Temp: 98.9 ?F (37.2 ?C) 99.3 ?F (37.4 ?C) 99.1 ?F (37.3 ?C) 98 ?F (36.7 ?C)  ?TempSrc: Oral Oral Oral   ?SpO2: 96% 92% 93% 95%  ?Weight:      ?Height:      ? ? ? ? ?Data Reviewed: ? ?Basic Metabolic Panel: ?Recent Labs  ?Lab 03/30/22 ?1119 03/31/22 ?0422  ?NA 132* 135  ?K 3.9 4.1  ?CL 95* 100  ?CO2 25 28  ?GLUCOSE 113* 101*  ?BUN 14 14  ?CREATININE 1.02 0.96   ?CALCIUM 10.2 8.8*  ? ? ?CBC: ?Recent Labs  ?Lab 03/30/22 ?1119 03/31/22 ?0422  ?WBC 15.6* 13.2*  ?HGB 13.9 12.4*  ?HCT 41.0 36.9*  ?MCV 94.0 97.9  ?PLT 275 252  ? ? ?LFT ?Recent Labs  ?Lab 03/30/22 ?1119 03/31/22 ?0422  ?AST 17 16  ?ALT 16 15  ?ALKPHOS 73 62  ?BILITOT 0.5 0.3  ?PROT 8.3* 7.0  ?ALBUMIN 4.6 3.5  ? ?  ?Antibiotics: ?Anti-infectives (From admission, onward)  ? ? Start     Dose/Rate Route Frequency Ordered Stop  ? 03/30/22 2245  piperacillin-tazobactam (ZOSYN) IVPB 3.375 g       ?See Hyperspace for full Linked Orders Report.  ? 3.375 g ?12.5 mL/hr over 240 Minutes Intravenous Every 8 hours 03/30/22 1430 04/04/22 2244  ? 03/30/22 1445  piperacillin-tazobactam (ZOSYN) IVPB 3.375 g       ?See Hyperspace for full Linked Orders Report.  ? 3.375 g ?100 mL/hr over 30 Minutes Intravenous  Once 03/30/22 1430 03/30/22 1543  ? ?  ? ? ? ?DVT prophylaxis: Lovenox ? ?Code Status: Full code ? ?Family Communication: No family at bedside ? ? ?CONSULTS General surgery ? ? ?Objective  ? ? ?Physical Examination: ? ? ?General-appears in no acute distress ?Heart-S1-S2, regular, no murmur auscultated ?Lungs-clear to auscultation bilaterally, no wheezing or crackles auscultated ?Abdomen-soft,  positive left lower quadrant tenderness to palpation, positive guarding, no rigidity ?Extremities-no edema in the lower extremities ?Neuro-alert, oriented x3, no focal deficit noted ? ? ?Status is: Inpatient: Complicated diverticulitis with abscess ? ? ? ?  ? ? ? ? ? ?Jim Moran ?  ?[facility] ?If 7PM-7AM, please contact night-coverage at [email or web address], ?Office  [phone] ? ? ?03/31/2022, 1:13 PM  LOS: 1 day  ? ? ? ? ? ? ? ? ? ? ?  ?

## 2022-03-31 NOTE — Progress Notes (Signed)
Transition of Care (TOC) Screening Note ? ?Patient Details  ?Name: Jim Moran ?Date of Birth: 1961/04/12 ? ?Transition of Care (TOC) CM/SW Contact:    ?Ewing Schlein, LCSW ?Phone Number: ?03/31/2022, 11:17 AM ? ?Transition of Care Department [facility]) has reviewed patient and no TOC needs have been identified at this time. We will continue to monitor patient advancement through interdisciplinary progression rounds. If new patient transition needs arise, please place a TOC consult. ?

## 2022-04-01 DIAGNOSIS — Z86010 Personal history of colonic polyps: Secondary | ICD-10-CM | POA: Diagnosis not present

## 2022-04-01 DIAGNOSIS — I10 Essential (primary) hypertension: Secondary | ICD-10-CM | POA: Diagnosis not present

## 2022-04-01 DIAGNOSIS — K5720 Diverticulitis of large intestine with perforation and abscess without bleeding: Secondary | ICD-10-CM | POA: Diagnosis not present

## 2022-04-01 LAB — COMPREHENSIVE METABOLIC PANEL
ALT: 16 U/L (ref 0–44)
AST: 17 U/L (ref 15–41)
Albumin: 3.2 g/dL — ABNORMAL LOW (ref 3.5–5.0)
Alkaline Phosphatase: 63 U/L (ref 38–126)
Anion gap: 9 (ref 5–15)
BUN: 8 mg/dL (ref 6–20)
CO2: 27 mmol/L (ref 22–32)
Calcium: 8.7 mg/dL — ABNORMAL LOW (ref 8.9–10.3)
Chloride: 98 mmol/L (ref 98–111)
Creatinine, Ser: 0.93 mg/dL (ref 0.61–1.24)
GFR, Estimated: >60 mL/min (ref >60)
Glucose, Bld: 114 mg/dL — ABNORMAL HIGH (ref 70–99)
Potassium: 3.8 mmol/L (ref 3.5–5.1)
Sodium: 134 mmol/L — ABNORMAL LOW (ref 135–145)
Total Bilirubin: 0.6 mg/dL (ref 0.3–1.2)
Total Protein: 6.5 g/dL (ref 6.5–8.1)

## 2022-04-01 LAB — CBC
HCT: 36.7 % — ABNORMAL LOW (ref 39.0–52.0)
Hemoglobin: 12.5 g/dL — ABNORMAL LOW (ref 13.0–17.0)
MCH: 32.7 pg (ref 26.0–34.0)
MCHC: 34.1 g/dL (ref 30.0–36.0)
MCV: 96.1 fL (ref 80.0–100.0)
Platelets: 285 10*3/uL (ref 150–400)
RBC: 3.82 MIL/uL — ABNORMAL LOW (ref 4.22–5.81)
RDW: 11.8 % (ref 11.5–15.5)
WBC: 12.6 10*3/uL — ABNORMAL HIGH (ref 4.0–10.5)
nRBC: 0 % (ref 0.0–0.2)

## 2022-04-01 MED ORDER — DIPHENHYDRAMINE-APAP (SLEEP) 25-500 MG PO TABS: 1.0000 | ORAL_TABLET | Freq: Every evening | ORAL | Status: DC | PRN

## 2022-04-01 MED ORDER — CYCLOBENZAPRINE HCL 10 MG PO TABS
10.0000 mg | ORAL_TABLET | Freq: Three times a day (TID) | ORAL | Status: DC | PRN
  Administered 2022-04-01 – 2022-04-04 (×6): 10 mg via ORAL
  Filled 2022-04-01 (×8): qty 1

## 2022-04-01 MED ORDER — OXYCODONE HCL 5 MG PO TABS
10.0000 mg | ORAL_TABLET | ORAL | Status: DC | PRN
  Administered 2022-04-01 – 2022-04-04 (×9): 10 mg via ORAL
  Filled 2022-04-01 (×10): qty 2

## 2022-04-01 MED ORDER — LISINOPRIL 20 MG PO TABS
20.0000 mg | ORAL_TABLET | Freq: Every day | ORAL | Status: DC
  Administered 2022-04-01 – 2022-04-07 (×7): 20 mg via ORAL
  Filled 2022-04-01 (×7): qty 1

## 2022-04-01 MED ORDER — AMLODIPINE BESYLATE 5 MG PO TABS
5.0000 mg | ORAL_TABLET | Freq: Every day | ORAL | Status: DC
  Administered 2022-04-01 – 2022-04-07 (×7): 5 mg via ORAL
  Filled 2022-04-01 (×7): qty 1

## 2022-04-01 MED ORDER — OXYCODONE HCL 5 MG PO TABS
5.0000 mg | ORAL_TABLET | ORAL | Status: DC | PRN
  Administered 2022-04-01 (×2): 5 mg via ORAL
  Filled 2022-04-01 (×2): qty 1

## 2022-04-01 MED ORDER — ACETAMINOPHEN 500 MG PO TABS: 500.0000 mg | ORAL_TABLET | Freq: Every evening | ORAL | Status: DC | PRN

## 2022-04-01 MED ORDER — DIPHENHYDRAMINE HCL 25 MG PO CAPS: 25.0000 mg | ORAL_CAPSULE | Freq: Every evening | ORAL | Status: DC | PRN

## 2022-04-01 NOTE — Plan of Care (Signed)
?  Problem: Education: ?Goal: Knowledge of General Education information will improve ?Description: Including pain rating scale, medication(s)/side effects and non-pharmacologic comfort measures ?Outcome: Progressing ?  ?Problem: Activity: ?Goal: Risk for activity intolerance will decrease ?Outcome: Adequate for Discharge ?  ?Problem: Pain Managment: ?Goal: General experience of comfort will improve ?Outcome: Progressing ?  ?

## 2022-04-01 NOTE — Progress Notes (Signed)
[facility] ? ?PROGRESS NOTE ? ?Jim Moran [id number] DOB: [birth year] DOA: 03/30/2022 ?PCP: Pieter Partridge, PA ? ? ?Brief HPI:   ?62 year old male with history of hypertension presents with 6-day history of abdominal pain, pain was located in left lower quadrant.  Also complained of constipation.  CT scan abdomen pelvis showed sigmoid diverticulitis with 2.8 x 1.2 cm small abscess within the left lateral wall of the sigmoid colon.  General surgery was consulted for IV antibiotics.  Patient started on IV Zosyn. ? ? ?Subjective  ? ?Patient seen and examined, states that pain is little improved.  Tolerating clear liquid diet well.  Denies nausea or vomiting.  Patient is afebrile, passing gas.  No bowel movement yet. ? ? Assessment/Plan:  ? ? ?Acute complicated sigmoid diverticulitis ?-Slowly improving ?-CT scan abdomen pelvis shows sigmoid diverticulitis with abscess 2.8 x 1.2 cm in the lateral wall of sigmoid colon ?-Patient started on IV Zosyn ?-WBC is down to 12.6 ?-Continue LR at 125 mill per hour ?-General surgery has started on clear liquid diet ?-He will need colonoscopy in 6 to 8 weeks to rule out malignancy ? ?Hypertension ?-Blood pressure is intermittently elevated ?-Not on medications at home ?-We will start hydralazine as needed ? ?History of colon polyps ?-Patient had a colonoscopy 10 years ago ?-He will need to follow-up with gastroenterology as outpatient ? ? ?Medications ? ?  ? enoxaparin (LOVENOX) injection  40 mg Subcutaneous Q24H  ? lip balm  1 application. Topical BID  ? ? ? Data Reviewed:  ? ?CBG: ? ?No results for input(s): GLUCAP in the last 168 hours. ? ?SpO2: 92 %  ? ? ?Vitals:  ? 03/31/22 0628 03/31/22 1225 03/31/22 2129 04/01/22 0501  ?BP: (!) 116/58 (!) 153/94 136/88 123/73  ?Pulse: 82 76 69 66  ?Resp: 16 16 18 18   ?Temp: 99.1 ?F (37.3 ?C) 98 ?F (36.7 ?C) 99.4 ?F (37.4 ?C) 98.5 ?F (36.9 ?C)  ?TempSrc: Oral  Oral Oral  ?SpO2: 93% 95% 95% 92%  ?Weight:      ?Height:       ? ? ? ? ?Data Reviewed: ? ?Basic Metabolic Panel: ?Recent Labs  ?Lab 03/30/22 ?1119 03/31/22 ?0422 04/01/22 ?[id number]  ?NA 132* 135 134*  ?K 3.9 4.1 3.8  ?CL 95* 100 98  ?CO2 25 28 27   ?GLUCOSE 113* 101* 114*  ?BUN 14 14 8   ?CREATININE 1.02 0.96 0.93  ?CALCIUM 10.2 8.8* 8.7*  ? ? ?CBC: ?Recent Labs  ?Lab 03/30/22 ?1119 03/31/22 ?0422 04/01/22 ?[id number]  ?WBC 15.6* 13.2* 12.6*  ?HGB 13.9 12.4* 12.5*  ?HCT 41.0 36.9* 36.7*  ?MCV 94.0 97.9 96.1  ?PLT 275 252 285  ? ? ?LFT ?Recent Labs  ?Lab 03/30/22 ?1119 03/31/22 ?0422 04/01/22 ?[id number]  ?AST 17 16 17   ?ALT 16 15 16   ?ALKPHOS 73 62 63  ?BILITOT 0.5 0.3 0.6  ?PROT 8.3* 7.0 6.5  ?ALBUMIN 4.6 3.5 3.2*  ? ?  ?Antibiotics: ?Anti-infectives (From admission, onward)  ? ? Start     Dose/Rate Route Frequency Ordered Stop  ? 03/30/22 2245  piperacillin-tazobactam (ZOSYN) IVPB 3.375 g       ?See Hyperspace for full Linked Orders Report.  ? 3.375 g ?12.5 mL/hr over 240 Minutes Intravenous Every 8 hours 03/30/22 1430 04/04/22 2244  ? 03/30/22 1445  piperacillin-tazobactam (ZOSYN) IVPB 3.375 g       ?See Hyperspace for full Linked Orders Report.  ? 3.375 g ?100 mL/hr over 30 Minutes Intravenous  Once  03/30/22 1430 03/30/22 1543  ? ?  ? ? ? ?DVT prophylaxis: Lovenox ? ?Code Status: Full code ? ?Family Communication: No family at bedside ? ? ?CONSULTS General surgery ? ? ?Objective  ? ? ?Physical Examination: ? ? ?General-appears in no acute distress ?Heart-S1-S2, regular, no murmur auscultated ?Lungs-clear to auscultation bilaterally, no wheezing or crackles auscultated ?Abdomen-soft, mild tenderness in left lower quadrant, no guarding or rigidity elicited , no organomegaly ?Extremities-no edema in the lower extremities ?Neuro-alert, oriented x3, no focal deficit noted ? ? ?Status is: Inpatient: Complicated diverticulitis with abscess ? ? ? ?  ? ? ? ?Oswald Hillock ?  ?[facility] ?If 7PM-7AM, please contact night-coverage at [email or web address], ?Office  [phone] ? ? ?04/01/2022, 9:20 AM   LOS: 2 days  ? ? ? ? ? ? ? ? ? ? ?  ?

## 2022-04-01 NOTE — Progress Notes (Signed)
? ? ?   ?Subjective: ?Pain is overall better as it is no longer diffuse, but very focal and localized in the LLQ.  Tolerating CLD with no issues.  No BM, +flatus.  No nausea.  Still requiring pain medications ? ?ROS: See above, otherwise other systems negative ? ?Objective: ?Vital signs in last 24 hours: ?Temp:  [98 ?F (36.7 ?C)-99.4 ?F (37.4 ?C)] 98.5 ?F (36.9 ?C) (04/19 0501) ?Pulse Rate:  [66-76] 66 (04/19 0501) ?Resp:  [16-18] 18 (04/19 0501) ?BP: (123-153)/(73-94) 123/73 (04/19 0501) ?SpO2:  [92 %-95 %] 92 % (04/19 0501) ?Last BM Date : 03/27/22 ? ?Intake/Output from previous day: ?04/18 0701 - 04/19 0700 ?In: 3664 [P.O.:1560; I.V.:1953.9; IV Piggyback:150.1] ?Out: 500 [Urine:500] ?Intake/Output this shift: ?No intake/output data recorded. ? ?PE: ?Heart: regular ?Lungs: CTAB ?Abd: soft, very focally tender in LLQ, but otherwise nontender elsewhere, +BS, ND ? ?Lab Results:  ?Recent Labs  ?  03/31/22 ?0422 04/01/22 ?0440  ?WBC 13.2* 12.6*  ?HGB 12.4* 12.5*  ?HCT 36.9* 36.7*  ?PLT 252 285  ? ?BMET ?Recent Labs  ?  03/31/22 ?0422 04/01/22 ?0440  ?NA 135 134*  ?K 4.1 3.8  ?CL 100 98  ?CO2 28 27  ?GLUCOSE 101* 114*  ?BUN 14 8  ?CREATININE 0.96 0.93  ?CALCIUM 8.8* 8.7*  ? ?PT/INR ?Recent Labs  ?  03/30/22 ?1116  ?LABPROT 13.2  ?INR 1.0  ? ?CMP  ?   ?Component Value Date/Time  ? NA 134 (L) 04/01/2022 0440  ? K 3.8 04/01/2022 0440  ? CL 98 04/01/2022 0440  ? CO2 27 04/01/2022 0440  ? GLUCOSE 114 (H) 04/01/2022 0440  ? BUN 8 04/01/2022 0440  ? CREATININE 0.93 04/01/2022 0440  ? CALCIUM 8.7 (L) 04/01/2022 0440  ? PROT 6.5 04/01/2022 0440  ? ALBUMIN 3.2 (L) 04/01/2022 0440  ? AST 17 04/01/2022 0440  ? ALT 16 04/01/2022 0440  ? ALKPHOS 63 04/01/2022 0440  ? BILITOT 0.6 04/01/2022 0440  ? GFRNONAA >60 04/01/2022 0440  ? GFRAA 53 (L) 06/15/2019 0149  ? ?Lipase  ?   ?Component Value Date/Time  ? LIPASE <10 (L) 03/30/2022 1119  ? ? ? ? ? ?Studies/Results: ?CT ABDOMEN PELVIS W CONTRAST ? ?Result Date: 03/30/2022 ?CLINICAL DATA:   Abdominal pain. LEFT lower quadrant pain radiating to the RIGHT side. EXAM: CT ABDOMEN AND PELVIS WITH CONTRAST TECHNIQUE: Multidetector CT imaging of the abdomen and pelvis was performed using the standard protocol following bolus administration of intravenous contrast. RADIATION DOSE REDUCTION: This exam was performed according to the departmental dose-optimization program which includes automated exposure control, adjustment of the mA and/or kV according to patient size and/or use of iterative reconstruction technique. CONTRAST:  33mL OMNIPAQUE IOHEXOL 300 MG/ML  SOLN COMPARISON:  None. FINDINGS: Lower chest: No acute abnormality. Hepatobiliary: No focal liver abnormality is seen. No gallstones, gallbladder wall thickening, or biliary dilatation. Pancreas: Unremarkable. No pancreatic ductal dilatation or surrounding inflammatory changes. Spleen: No acute findings. Chronic/dystrophic calcifications within the upper spleen. Adrenals/Urinary Tract: Adrenal glands appear normal. Kidneys are unremarkable without mass, stone or hydronephrosis. No perinephric fluid or inflammation. Bladder is decompressed. Walls of the upper bladder appear thickened. Stomach/Bowel: Prominent thickening of the walls of a 10 cm segment of the mid sigmoid colon, with extensive surrounding pericolonic inflammation/fluid stranding. Irregular hypoechoic collection along the LEFT lateral wall of the affected portion of the sigmoid colon measures 2.8 x 1.2 cm, suspicious for early abscess within the wall of the sigmoid colon. More proximal portion of  the large bowel is normal in caliber and without additional site of bowel wall inflammation. Appendix is normal. Small bowel is normal in caliber. Stomach is unremarkable, partially decompressed. Vascular/Lymphatic: Aortic atherosclerosis. No acute-appearing vascular abnormality. No enlarged lymph nodes are seen. Reproductive: Prostate is unremarkable. Other: Small amount of free fluid layering  within the pelvis. No free intraperitoneal air. Musculoskeletal: No acute-appearing osseous abnormality. Mild degenerative spondylosis of the thoracic and lumbar spine. RIGHT inguinal hernia which contains fat only. IMPRESSION: 1. Acute diverticulitis of the sigmoid colon. Associated prominent thickening of the walls of the mid sigmoid colon (involving a 10 cm segment of the sigmoid colon), with extensive surrounding pericolonic inflammation/fluid stranding. 2. Suspected early/small abscess collection within the LEFT lateral wall of the sigmoid colon, measuring 2.8 x 1.2 cm (series 2, image 69). Recommend close clinical and/or imaging follow-up. 3. Upper bladder walls appear thickened, likely reactive to the overlying sigmoid diverticulitis. 4. No associated bowel obstruction.  No free intraperitoneal air. 5. RIGHT inguinal hernia which contains fat only. Aortic Atherosclerosis (ICD10-I70.0). Electronically Signed   By: Bary Richard M.D.   On: 03/30/2022 13:02   ? ?Anti-infectives: ?Anti-infectives (From admission, onward)  ? ? Start     Dose/Rate Route Frequency Ordered Stop  ? 03/30/22 2245  piperacillin-tazobactam (ZOSYN) IVPB 3.375 g       ?See Hyperspace for full Linked Orders Report.  ? 3.375 g ?12.5 mL/hr over 240 Minutes Intravenous Every 8 hours 03/30/22 1430 04/04/22 2244  ? 03/30/22 1445  piperacillin-tazobactam (ZOSYN) IVPB 3.375 g       ?See Hyperspace for full Linked Orders Report.  ? 3.375 g ?100 mL/hr over 30 Minutes Intravenous  Once 03/30/22 1430 03/30/22 1543  ? ?  ? ? ? ?Assessment/Plan ?Diverticulitis with small abscess and phlegmon ?-cont IV abx therapy ?-WBC relatively stable, but downtrending still 13 -->12K, follow ?-adv to FLD ?-continue to closely monitor, given how tender he is focally in the LLQ, may require a repeat CT in next 1-2 days to evaluate for maturity of this fluid collection previously seen ?-mobilize ?-cont to follow  ? ?FEN - FLD ?VTE - Lovenox ?ID - zosyn ? ?I reviewed  hospitalist notes, last 24 h vitals and pain scores, last 48 h intake and output, last 24 h labs and trends, and last 24 h imaging results. ? ? LOS: 2 days  ? ? ?Letha Cape , PA-C ?[facility] ?04/01/2022, 9:58 AM ?Please see Amion for pager number during day hours 7:00am-4:30pm or 7:00am -11:30am on weekends ? ?

## 2022-04-02 DIAGNOSIS — K5720 Diverticulitis of large intestine with perforation and abscess without bleeding: Secondary | ICD-10-CM | POA: Diagnosis not present

## 2022-04-02 DIAGNOSIS — I10 Essential (primary) hypertension: Secondary | ICD-10-CM | POA: Diagnosis not present

## 2022-04-02 DIAGNOSIS — Z86010 Personal history of colonic polyps: Secondary | ICD-10-CM | POA: Diagnosis not present

## 2022-04-02 LAB — BASIC METABOLIC PANEL
Anion gap: 8 (ref 5–15)
BUN: 8 mg/dL (ref 6–20)
CO2: 32 mmol/L (ref 22–32)
Calcium: 9.1 mg/dL (ref 8.9–10.3)
Chloride: 96 mmol/L — ABNORMAL LOW (ref 98–111)
Creatinine, Ser: 1.1 mg/dL (ref 0.61–1.24)
GFR, Estimated: >60 mL/min (ref >60)
Glucose, Bld: 114 mg/dL — ABNORMAL HIGH (ref 70–99)
Potassium: 4.4 mmol/L (ref 3.5–5.1)
Sodium: 136 mmol/L (ref 135–145)

## 2022-04-02 LAB — CBC
HCT: 37.9 % — ABNORMAL LOW (ref 39.0–52.0)
Hemoglobin: 12.5 g/dL — ABNORMAL LOW (ref 13.0–17.0)
MCH: 32.1 pg (ref 26.0–34.0)
MCHC: 33 g/dL (ref 30.0–36.0)
MCV: 97.4 fL (ref 80.0–100.0)
Platelets: 341 10*3/uL (ref 150–400)
RBC: 3.89 MIL/uL — ABNORMAL LOW (ref 4.22–5.81)
RDW: 11.9 % (ref 11.5–15.5)
WBC: 10.9 10*3/uL — ABNORMAL HIGH (ref 4.0–10.5)
nRBC: 0 % (ref 0.0–0.2)

## 2022-04-02 MED ORDER — LACTATED RINGERS IV SOLN: INTRAVENOUS | Status: DC

## 2022-04-02 NOTE — Plan of Care (Signed)
?  Problem: Education: ?Goal: Knowledge of General Education information will improve ?Description: Including pain rating scale, medication(s)/side effects and non-pharmacologic comfort measures ?04/02/2022 1744 by Sherian Maroon, RN ?Outcome: Progressing ?04/02/2022 1744 by Sherian Maroon, RN ?Outcome: Progressing ?  ?Problem: Health Behavior/Discharge Planning: ?Goal: Ability to manage health-related needs will improve ?04/02/2022 1744 by Sherian Maroon, RN ?Outcome: Progressing ?04/02/2022 1744 by Sherian Maroon, RN ?Outcome: Progressing ?  ?Problem: Clinical Measurements: ?Goal: Ability to maintain clinical measurements within normal limits will improve ?04/02/2022 1744 by Sherian Maroon, RN ?Outcome: Progressing ?04/02/2022 1744 by Sherian Maroon, RN ?Outcome: Progressing ?Goal: Will remain free from infection ?04/02/2022 1744 by Sherian Maroon, RN ?Outcome: Progressing ?04/02/2022 1744 by Sherian Maroon, RN ?Outcome: Progressing ?Goal: Diagnostic test results will improve ?04/02/2022 1744 by Sherian Maroon, RN ?Outcome: Progressing ?04/02/2022 1744 by Sherian Maroon, RN ?Outcome: Progressing ?Goal: Respiratory complications will improve ?04/02/2022 1744 by Sherian Maroon, RN ?Outcome: Progressing ?04/02/2022 1744 by Sherian Maroon, RN ?Outcome: Progressing ?Goal: Cardiovascular complication will be avoided ?04/02/2022 1744 by Sherian Maroon, RN ?Outcome: Progressing ?04/02/2022 1744 by Sherian Maroon, RN ?Outcome: Progressing ?  ?Problem: Activity: ?Goal: Risk for activity intolerance will decrease ?04/02/2022 1744 by Sherian Maroon, RN ?Outcome: Progressing ?04/02/2022 1744 by Sherian Maroon, RN ?Outcome: Progressing ?  ?Problem: Nutrition: ?Goal: Adequate nutrition will be maintained ?04/02/2022 1744 by Sherian Maroon, RN ?Outcome: Progressing ?04/02/2022 1744 by Sherian Maroon, RN ?Outcome: Progressing ?  ?Problem: Coping: ?Goal: Level of anxiety will  decrease ?04/02/2022 1744 by Sherian Maroon, RN ?Outcome: Progressing ?04/02/2022 1744 by Sherian Maroon, RN ?Outcome: Progressing ?  ?Problem: Elimination: ?Goal: Will not experience complications related to bowel motility ?04/02/2022 1744 by Sherian Maroon, RN ?Outcome: Progressing ?04/02/2022 1744 by Sherian Maroon, RN ?Outcome: Progressing ?Goal: Will not experience complications related to urinary retention ?04/02/2022 1744 by Sherian Maroon, RN ?Outcome: Progressing ?04/02/2022 1744 by Sherian Maroon, RN ?Outcome: Progressing ?  ?Problem: Pain Managment: ?Goal: General experience of comfort will improve ?04/02/2022 1744 by Sherian Maroon, RN ?Outcome: Progressing ?04/02/2022 1744 by Sherian Maroon, RN ?Outcome: Progressing ?  ?Problem: Safety: ?Goal: Ability to remain free from injury will improve ?04/02/2022 1744 by Sherian Maroon, RN ?Outcome: Progressing ?04/02/2022 1744 by Sherian Maroon, RN ?Outcome: Progressing ?  ?Problem: Skin Integrity: ?Goal: Risk for impaired skin integrity will decrease ?04/02/2022 1744 by Sherian Maroon, RN ?Outcome: Progressing ?04/02/2022 1744 by Sherian Maroon, RN ?Outcome: Progressing ?  ?

## 2022-04-02 NOTE — Progress Notes (Signed)
[facility] ? ?PROGRESS NOTE ? ?Jim Moran SPQ:[id number] DOB: [birth year] DOA: 03/30/2022 ?PCP: Roderick Pee, PA ? ? ?Brief HPI:   ?61 year old male with history of hypertension presents with 6-day history of abdominal pain, pain was located in left lower quadrant.  Also complained of constipation.  CT scan abdomen pelvis showed sigmoid diverticulitis with 2.8 x 1.2 cm small abscess within the left lateral wall of the sigmoid colon.  General surgery was consulted for IV antibiotics.  Patient started on IV Zosyn. ? ? ?Subjective  ? ?Patient seen and examined, tolerating full liquid diet well.  No nausea or vomiting.  Pain has improved.  No bowel movement yet. ? ? Assessment/Plan:  ? ? ?Acute complicated sigmoid diverticulitis ?-Slowly improving ?-CT scan abdomen pelvis shows sigmoid diverticulitis with abscess 2.8 x 1.2 cm in the lateral wall of sigmoid colon ?-Patient started on IV Zosyn ?-WBC is down to 10.9 ?-Continue LR at  100 mL/h ?-Tolerating full liquid diet well ?-General surgery following ?-He will need colonoscopy in 6 to 8 weeks to rule out malignancy ? ?Hypertension ?-Blood pressure is intermittently elevated ?-Not on medications at home ?-Continue  hydralazine as needed ? ?History of colon polyps ?-Patient had a colonoscopy 10 years ago ?-He will need to follow-up with gastroenterology as outpatient ? ? ?Medications ? ?  ? amLODipine  5 mg Oral Daily  ? enoxaparin (LOVENOX) injection  40 mg Subcutaneous Q24H  ? lip balm  1 application. Topical BID  ? lisinopril  20 mg Oral Daily  ? ? ? Data Reviewed:  ? ?CBG: ? ?No results for input(s): GLUCAP in the last 168 hours. ? ?SpO2: 94 %  ? ? ?Vitals:  ? 04/01/22 0501 04/01/22 1242 04/01/22 2017 04/02/22 0517  ?BP: 123/73 117/84 104/67 101/65  ?Pulse: 66 68 68 65  ?Resp: 18 16 18 18   ?Temp: 98.5 ?F (36.9 ?C) 98.1 ?F (36.7 ?C) 99 ?F (37.2 ?C) 98.7 ?F (37.1 ?C)  ?TempSrc: Oral Oral Oral Oral  ?SpO2: 92% 97% 95% 94%  ?Weight:      ?Height:       ? ? ? ? ?Data Reviewed: ? ?Basic Metabolic Panel: ?Recent Labs  ?Lab 03/30/22 ?1119 03/31/22 ?0422 04/01/22 ?0440 04/02/22 ?04/04/22  ?NA 132* 135 134* 136  ?K 3.9 4.1 3.8 4.4  ?CL 95* 100 98 96*  ?CO2 25 28 27  32  ?GLUCOSE 113* 101* 114* 114*  ?BUN 14 14 8 8   ?CREATININE 1.02 0.96 0.93 1.10  ?CALCIUM 10.2 8.8* 8.7* 9.1  ? ? ?CBC: ?Recent Labs  ?Lab 03/30/22 ?1119 03/31/22 ?0422 04/01/22 ?0440 04/02/22 ?04/02/22  ?WBC 15.6* 13.2* 12.6* 10.9*  ?HGB 13.9 12.4* 12.5* 12.5*  ?HCT 41.0 36.9* 36.7* 37.9*  ?MCV 94.0 97.9 96.1 97.4  ?PLT 275 252 285 341  ? ? ?LFT ?Recent Labs  ?Lab 03/30/22 ?1119 03/31/22 ?0422 04/01/22 ?0440  ?AST 17 16 17   ?ALT 16 15 16   ?ALKPHOS 73 62 63  ?BILITOT 0.5 0.3 0.6  ?PROT 8.3* 7.0 6.5  ?ALBUMIN 4.6 3.5 3.2*  ? ?  ?Antibiotics: ?Anti-infectives (From admission, onward)  ? ? Start     Dose/Rate Route Frequency Ordered Stop  ? 03/30/22 2245  piperacillin-tazobactam (ZOSYN) IVPB 3.375 g       ?See Hyperspace for full Linked Orders Report.  ? 3.375 g ?12.5 mL/hr over 240 Minutes Intravenous Every 8 hours 03/30/22 1430 04/04/22 2244  ? 03/30/22 1445  piperacillin-tazobactam (ZOSYN) IVPB 3.375 g       ?  See Hyperspace for full Linked Orders Report.  ? 3.375 g ?100 mL/hr over 30 Minutes Intravenous  Once 03/30/22 1430 03/30/22 1543  ? ?  ? ? ? ?DVT prophylaxis: Lovenox ? ?Code Status: Full code ? ?Family Communication: No family at bedside ? ? ?CONSULTS General surgery ? ? ?Objective  ? ? ?Physical Examination: ? ? ?General-appears in no acute distress ?Heart-S1-S2, regular, no murmur auscultated ?Lungs-clear to auscultation bilaterally, no wheezing or crackles auscultated ?Abdomen-soft, mild tenderness in left lower quadrant to palpation, no organomegaly, no rigidity or guarding ?Extremities-no edema in the lower extremities ?Neuro-alert, oriented x3, no focal deficit noted ? ? ?Status is: Inpatient: Complicated diverticulitis with abscess ? ? ? ?  ? ? ? ?[name] ?  ?[facility] ?If 7PM-7AM,  please contact night-coverage at [email or web address], ?Office  [phone] ? ? ?04/02/2022, 1:29 PM  LOS: 3 days  ? ? ? ? ? ? ? ? ? ? ?  ?

## 2022-04-02 NOTE — Progress Notes (Signed)
? ? ?   ?Subjective: ?Still requiring pain medication for focal pain in LLQ.  Tolerating FLD with no issues there.  Pain in LLQ really not changed much, if so, only slightly improved ? ?ROS: See above, otherwise other systems negative ? ?Objective: ?Vital signs in last 24 hours: ?Temp:  [98.1 ?F (36.7 ?C)-99 ?F (37.2 ?C)] 98.7 ?F (37.1 ?C) (04/20 0517) ?Pulse Rate:  [65-68] 65 (04/20 0517) ?Resp:  [16-18] 18 (04/20 0517) ?BP: (101-117)/(65-84) 101/65 (04/20 0517) ?SpO2:  [94 %-97 %] 94 % (04/20 0517) ?Last BM Date : 03/27/22 ? ?Intake/Output from previous day: ?04/19 0701 - 04/20 0700 ?In: 632.1 [P.O.:480; IV Piggyback:152.1] ?Out: 0  ?Intake/Output this shift: ?No intake/output data recorded. ? ?PE: ?Heart: regular ?Lungs: CTAB ?Abd: soft, very focally tender in LLQ, but otherwise nontender elsewhere, +BS, ND ? ?Lab Results:  ?Recent Labs  ?  04/01/22 ?0440 04/02/22 ?0412  ?WBC 12.6* 10.9*  ?HGB 12.5* 12.5*  ?HCT 36.7* 37.9*  ?PLT 285 341  ? ?BMET ?Recent Labs  ?  04/01/22 ?0440 04/02/22 ?0412  ?NA 134* 136  ?K 3.8 4.4  ?CL 98 96*  ?CO2 27 32  ?GLUCOSE 114* 114*  ?BUN 8 8  ?CREATININE 0.93 1.10  ?CALCIUM 8.7* 9.1  ? ?PT/INR ?Recent Labs  ?  03/30/22 ?1116  ?LABPROT 13.2  ?INR 1.0  ? ?CMP  ?   ?Component Value Date/Time  ? NA 136 04/02/2022 0412  ? K 4.4 04/02/2022 0412  ? CL 96 (L) 04/02/2022 0412  ? CO2 32 04/02/2022 0412  ? GLUCOSE 114 (H) 04/02/2022 0412  ? BUN 8 04/02/2022 0412  ? CREATININE 1.10 04/02/2022 0412  ? CALCIUM 9.1 04/02/2022 0412  ? PROT 6.5 04/01/2022 0440  ? ALBUMIN 3.2 (L) 04/01/2022 0440  ? AST 17 04/01/2022 0440  ? ALT 16 04/01/2022 0440  ? ALKPHOS 63 04/01/2022 0440  ? BILITOT 0.6 04/01/2022 0440  ? GFRNONAA >60 04/02/2022 0412  ? GFRAA 53 (L) 06/15/2019 0149  ? ?Lipase  ?   ?Component Value Date/Time  ? LIPASE <10 (L) 03/30/2022 1119  ? ? ? ? ? ?Studies/Results: ?No results found. ? ?Anti-infectives: ?Anti-infectives (From admission, onward)  ? ? Start     Dose/Rate Route Frequency Ordered  Stop  ? 03/30/22 2245  piperacillin-tazobactam (ZOSYN) IVPB 3.375 g       ?See Hyperspace for full Linked Orders Report.  ? 3.375 g ?12.5 mL/hr over 240 Minutes Intravenous Every 8 hours 03/30/22 1430 04/04/22 2244  ? 03/30/22 1445  piperacillin-tazobactam (ZOSYN) IVPB 3.375 g       ?See Hyperspace for full Linked Orders Report.  ? 3.375 g ?100 mL/hr over 30 Minutes Intravenous  Once 03/30/22 1430 03/30/22 1543  ? ?  ? ? ? ?Assessment/Plan ?Diverticulitis with small abscess and phlegmon ?-cont IV abx therapy ?-WBC relatively stable, but downtrending still 10K, follow ?-adv to soft diet ?-will go ahead and plan to repeat a CT a/p tomorrow morning to evaluate fluid collection given persistent pain in LLQ.  AF and labs are improving, but still concerned with amount of tenderness he has focally given known fluid collection. ?-mobilize ?-cont to follow  ? ?FEN - soft ?VTE - Lovenox ?ID - zosyn ? ?I reviewed hospitalist notes, last 24 h vitals and pain scores, last 48 h intake and output, last 24 h labs and trends, and last 24 h imaging results. ? ? LOS: 3 days  ? ? ?Letha Cape , PA-C ?[facility] ?04/02/2022,  10:19 AM ?Please see Amion for pager number during day hours 7:00am-4:30pm or 7:00am -11:30am on weekends ? ?

## 2022-04-03 ENCOUNTER — Encounter (HOSPITAL_COMMUNITY): Payer: Self-pay | Admitting: Family Medicine

## 2022-04-03 ENCOUNTER — Inpatient Hospital Stay (HOSPITAL_COMMUNITY): Payer: BC Managed Care – PPO

## 2022-04-03 DIAGNOSIS — K5720 Diverticulitis of large intestine with perforation and abscess without bleeding: Secondary | ICD-10-CM | POA: Diagnosis not present

## 2022-04-03 LAB — CBC
HCT: 37.4 % — ABNORMAL LOW (ref 39.0–52.0)
Hemoglobin: 12.4 g/dL — ABNORMAL LOW (ref 13.0–17.0)
MCH: 32.2 pg (ref 26.0–34.0)
MCHC: 33.2 g/dL (ref 30.0–36.0)
MCV: 97.1 fL (ref 80.0–100.0)
Platelets: 351 10*3/uL (ref 150–400)
RBC: 3.85 MIL/uL — ABNORMAL LOW (ref 4.22–5.81)
RDW: 11.9 % (ref 11.5–15.5)
WBC: 11.8 10*3/uL — ABNORMAL HIGH (ref 4.0–10.5)
nRBC: 0 % (ref 0.0–0.2)

## 2022-04-03 MED ORDER — FENTANYL CITRATE (PF) 100 MCG/2ML IJ SOLN
INTRAMUSCULAR | Status: AC | PRN
  Administered 2022-04-03 (×2): 50 ug via INTRAVENOUS

## 2022-04-03 MED ORDER — FENTANYL CITRATE (PF) 100 MCG/2ML IJ SOLN
INTRAMUSCULAR | Status: AC
  Filled 2022-04-03: qty 2

## 2022-04-03 MED ORDER — ENOXAPARIN SODIUM 40 MG/0.4ML IJ SOSY
40.0000 mg | PREFILLED_SYRINGE | INTRAMUSCULAR | Status: DC
  Administered 2022-04-04 – 2022-04-07 (×4): 40 mg via SUBCUTANEOUS
  Filled 2022-04-03 (×4): qty 0.4

## 2022-04-03 MED ORDER — MIDAZOLAM HCL 2 MG/2ML IJ SOLN
INTRAMUSCULAR | Status: AC | PRN
  Administered 2022-04-03 (×4): 1 mg via INTRAVENOUS

## 2022-04-03 MED ORDER — POLYETHYLENE GLYCOL 3350 17 G PO PACK
17.0000 g | PACK | Freq: Every day | ORAL | Status: DC
  Administered 2022-04-03: 17 g via ORAL

## 2022-04-03 MED ORDER — MIDAZOLAM HCL 2 MG/2ML IJ SOLN
INTRAMUSCULAR | Status: AC
  Filled 2022-04-03: qty 4

## 2022-04-03 MED ORDER — IOHEXOL 300 MG/ML  SOLN
100.0000 mL | Freq: Once | INTRAMUSCULAR | Status: AC | PRN
  Administered 2022-04-03: 100 mL via INTRAVENOUS

## 2022-04-03 MED ORDER — SODIUM CHLORIDE (PF) 0.9 % IJ SOLN
INTRAMUSCULAR | Status: AC
  Filled 2022-04-03: qty 50

## 2022-04-03 MED ORDER — SODIUM CHLORIDE 0.9% FLUSH
5.0000 mL | Freq: Three times a day (TID) | INTRAVENOUS | Status: DC
  Administered 2022-04-03 – 2022-04-07 (×11): 5 mL

## 2022-04-03 MED ORDER — LIDOCAINE HCL (PF) 1 % IJ SOLN
INTRAMUSCULAR | Status: AC | PRN
  Administered 2022-04-03: 15 mL

## 2022-04-03 MED ORDER — MORPHINE SULFATE (PF) 2 MG/ML IV SOLN
2.0000 mg | INTRAVENOUS | Status: DC | PRN
  Administered 2022-04-03 – 2022-04-04 (×2): 2 mg via INTRAVENOUS
  Filled 2022-04-03 (×2): qty 1

## 2022-04-03 MED ORDER — SODIUM CHLORIDE 0.9 % IV SOLN
1.0000 g | INTRAVENOUS | Status: DC
Start: 2022-04-03 — End: 2022-04-06
  Administered 2022-04-03 – 2022-04-05 (×3): 1000 mg via INTRAVENOUS
  Filled 2022-04-03 (×4): qty 1

## 2022-04-03 NOTE — Progress Notes (Signed)
?PROGRESS NOTE ? ? ? ?Jim Moran  OTL:[id number] DOB: [birth year] DOA: 03/30/2022 ?PCP: Roderick Pee, PA  ?Chief Complaint  ?Patient presents with  ? Abdominal Pain  ? ? ?Brief Narrative:  ?61 year old male with history of hypertension presents with 6-day history of abdominal pain, pain was located in left lower quadrant.  Also complained of constipation.  CT scan abdomen pelvis showed sigmoid diverticulitis with 2.8 x 1.2 cm small abscess within the left lateral wall of the sigmoid colon.  General surgery was consulted for IV antibiotics.  Patient started on IV Zosyn.  ? ? ?Assessment & Plan: ?  ?Principal Problem: ?  Abscess of sigmoid colon due to diverticulitis ?Active Problems: ?  Hypertension, essential ?  History of adenomatous polyp of colon ?  Diverticulitis of sigmoid colon with abscess ?  Diverticulitis ? ? ?Assessment and Plan: ?* Abscess of sigmoid colon due to diverticulitis ?CT 4/17 with diverticulitis of sigmoid colon, suspected early/small abscess collection within the left lateral wall of the sigmoid colon (2.8x1.2 cm), bladder wall thickening - likely reactive ?Repeat CT scan pending today ?Currently on zosyn, continue ?Soft diet, per surgery ?Appreciate surgery assistance ?Pain regimen, bowel regimen ?Colonoscopy 6-8 weeks to r/o malignancy ? ? ? ? ? ?Hypertension, essential ?Amlodipine ? ? ? ?DVT prophylaxis: lovenox ?Code Status: ful ?Family Communication: noen ?Disposition:  ? ?Status is: Inpatient ?Remains inpatient appropriate because: need for imaging, iv abx ?  ?Consultants:  ?surgery ? ?Procedures:  ?none ? ?Antimicrobials:  ?Anti-infectives (From admission, onward)  ? ? Start     Dose/Rate Route Frequency Ordered Stop  ? 03/30/22 2245  piperacillin-tazobactam (ZOSYN) IVPB 3.375 g       ?See Hyperspace for full Linked Orders Report.  ? 3.375 g ?12.5 mL/hr over 240 Minutes Intravenous Every 8 hours 03/30/22 1430 04/04/22 2244  ? 03/30/22 1445  piperacillin-tazobactam (ZOSYN) IVPB 3.375  g       ?See Hyperspace for full Linked Orders Report.  ? 3.375 g ?100 mL/hr over 30 Minutes Intravenous  Once 03/30/22 1430 03/30/22 1543  ? ?  ? ? ?Subjective: ?Continued abd pain, improved with meds ? ?Objective: ?Vitals:  ? 04/02/22 0517 04/02/22 1339 04/02/22 2118 04/03/22 9741  ?BP: 101/65 107/65 121/71 131/82  ?Pulse: 65 70 67 65  ?Resp: 18 18 18 18   ?Temp: 98.7 ?F (37.1 ?C) 99.6 ?F (37.6 ?C) 99 ?F (37.2 ?C) 98.4 ?F (36.9 ?C)  ?TempSrc: Oral Oral Oral Oral  ?SpO2: 94% 97% 97% 97%  ?Weight:      ?Height:      ? ? ?Intake/Output Summary (Last 24 hours) at 04/03/2022 0951 ?Last data filed at 04/03/2022 0600 ?Gross per 24 hour  ?Intake 1352.31 ml  ?Output --  ?Net 1352.31 ml  ? ?Filed Weights  ? 03/30/22 1110  ?Weight: 81.6 kg  ? ? ?Examination: ? ?General exam: Appears calm and comfortable  ?Respiratory system: unlabored ?Cardiovascular system: RRR ?Gastrointestinal system: TTP in LLQ ?Central nervous system: Alert and oriented. No focal neurological deficits. ?Extremities: no lee  ? ? ?Data Reviewed: I have personally reviewed following labs and imaging studies ? ?CBC: ?Recent Labs  ?Lab 03/30/22 ?1119 03/31/22 ?0422 04/01/22 ?0440 04/02/22 ?04/04/22 04/03/22 ?0409  ?WBC 15.6* 13.2* 12.6* 10.9* 11.8*  ?HGB 13.9 12.4* 12.5* 12.5* 12.4*  ?HCT 41.0 36.9* 36.7* 37.9* 37.4*  ?MCV 94.0 97.9 96.1 97.4 97.1  ?PLT 275 252 285 341 351  ? ? ?Basic Metabolic Panel: ?Recent Labs  ?Lab 03/30/22 ?1119 03/31/22 ?0422 04/01/22 ?0440  04/02/22 ?[id number]  ?NA 132* 135 134* 136  ?K 3.9 4.1 3.8 4.4  ?CL 95* 100 98 96*  ?CO2 25 28 27  32  ?GLUCOSE 113* 101* 114* 114*  ?BUN 14 14 8 8   ?CREATININE 1.02 0.96 0.93 1.10  ?CALCIUM 10.2 8.8* 8.7* 9.1  ? ? ?GFR: ?Estimated Creatinine Clearance: 70.8 mL/min (by C-G formula based on SCr of 1.1 mg/dL). ? ?Liver Function Tests: ?Recent Labs  ?Lab 03/30/22 ?1119 03/31/22 ?0422 04/01/22 ?0440  ?AST 17 16 17   ?ALT 16 15 16   ?ALKPHOS 73 62 63  ?BILITOT 0.5 0.3 0.6  ?PROT 8.3* 7.0 6.5  ?ALBUMIN 4.6 3.5 3.2*   ? ? ?CBG: ?No results for input(s): GLUCAP in the last 168 hours. ? ? ?Recent Results (from the past 240 hour(s))  ?Urine Culture     Status: None  ? Collection Time: 03/30/22 11:19 AM  ? Specimen: In/Out Cath Urine  ?Result Value Ref Range Status  ? Specimen Description   Final  ?  IN/OUT CATH URINE ?Performed at [facility], [address], [place], [place] [address] ?  ? Special Requests   Final  ?  NONE ?Performed at [facility], [address], [place], [place] [address] ?  ? Culture   Final  ?  NO GROWTH ?Performed at [facility], [address] [place], [place] [address] ?  ? Report Status 03/31/2022 FINAL  Final  ?Resp Panel by RT-PCR (Flu Jaze Rodino&B, Covid) Nasopharyngeal Swab     Status: None  ? Collection Time: 03/30/22  2:49 PM  ? Specimen: Nasopharyngeal Swab; Nasopharyngeal(NP) swabs in vial transport medium  ?Result Value Ref Range Status  ? SARS Coronavirus 2 by RT PCR NEGATIVE NEGATIVE Final  ?  Comment: (NOTE) ?SARS-CoV-2 target nucleic acids are NOT DETECTED. ? ?The SARS-CoV-2 RNA is generally detectable in upper respiratory ?specimens during the acute phase of infection. The lowest ?concentration of SARS-CoV-2 viral copies this assay can detect is ?138 copies/mL. Homero Hyson negative result does not preclude SARS-Cov-2 ?infection and should not be used as the sole basis for treatment or ?other patient management decisions. Shabnam Ladd negative result may occur with  ?improper specimen collection/handling, submission of specimen other ?than nasopharyngeal swab, presence of viral mutation(s) within the ?areas targeted by this assay, and inadequate number of viral ?copies(<138 copies/mL). Shanyla Marconi negative result must be combined with ?clinical observations, patient history, and epidemiological ?information. The expected result is Negative. ? ?Fact Sheet for Patients:  ?BloggerCourse.comhttps://www.fda.gov/media/152166/download ? ?Fact Sheet for Healthcare Providers:   ?SeriousBroker.ithttps://www.fda.gov/media/152162/download ? ?This test is no t yet approved or cleared by the [place] FDA and  ?has been authorized for detection and/or diagnosis of SARS-CoV-2 by ?FDA under an Emergency Use Authorization (EUA). This EUA will remain  ?in effect (meaning this test can be used) for the duration of the ?COVID-19 declaration under Section 564(b)(1) of the Act, 21 ?U.S.C.section 360bbb-3(b)(1), unless the authorization is terminated  ?or revoked sooner.  ? ? ?  ? Influenza Annisha Baar by PCR NEGATIVE NEGATIVE Final  ? Influenza B by PCR NEGATIVE NEGATIVE Final  ?  Comment: (NOTE) ?The Xpert Xpress SARS-CoV-2/FLU/RSV plus assay is intended as an aid ?in the diagnosis of influenza from Nasopharyngeal swab specimens and ?should not be used as Mathayus Stanbery sole basis for treatment. Nasal washings and ?aspirates are unacceptable for Xpert Xpress SARS-CoV-2/FLU/RSV ?testing. ? ?Fact Sheet for Patients: ?BloggerCourse.comhttps://www.fda.gov/media/152166/download ? ?Fact Sheet for Healthcare Providers: ?SeriousBroker.ithttps://www.fda.gov/media/152162/download ? ?This test is not yet approved or cleared by  the [employer] and ?has been authorized for detection and/or diagnosis of SARS-CoV-2 by ?FDA under an Emergency Use Authorization (EUA). This EUA will remain ?in effect (meaning this test can be used) for the duration of the ?COVID-19 declaration under Section 564(b)(1) of the Act, 21 U.S.C. ?section 360bbb-3(b)(1), unless the authorization is terminated or ?revoked. ? ?Performed at [facility], [address] [place], [place] [address] ?  ?Blood culture (routine x 2)     Status: None (Preliminary result)  ? Collection Time: 03/30/22  2:50 PM  ? Specimen: BLOOD  ?Result Value Ref Range Status  ? Specimen Description   Final  ?  BLOOD BLOOD RIGHT FOREARM ?Performed at [facility], [address] [place], [place] [address] ?  ? Special Requests   Final  ?  Blood Culture adequate volume BOTTLES DRAWN AEROBIC AND  ANAEROBIC ?Performed at [facility], [address] [place], [place] [address] ?  ? Culture   Final  ?  NO GROWTH 4 DAYS ?Performed at [facility], [address], [place], [place] [address] ?  ? Report Status

## 2022-04-03 NOTE — Progress Notes (Signed)
CT scan done this afternoon.  Looks like he has a loculated collection in the sigmoid mesentery region.  Discussed with Dr. Ardelle Anton with interventional radiology.  Hounsfield units may favor phlegmon but given the fact that the patient has persistent pain and ileus, agrees with attempting drain placement in the hopes of getting this under control and avoid Hartmann resection this hospitalization.  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  I am skeptical that he is going to avoid some need for surgery at some point given the severe complexity of the diverticulitis. ? ?We will switch his antibiotic regimen to ertapenem in the hopes that can turn things aroun as well ?

## 2022-04-03 NOTE — Consult Note (Signed)
? ?Chief Complaint: ?Patient was seen in consultation today for image guided drain placement  ?Chief Complaint  ?Patient presents with  ? Abdominal Pain  ? at the request of Barnetta Chapel PA-C ? ?Referring Physician(s): Barnetta Chapel PA-C ? ?Supervising Physician: Simonne Come ? ?Patient Status: [facility] - In-pt ? ?History of Present Illness: ?Jim Moran is a 61 y.o. male with no significant PMH who presented to [facility] ED on 03/30/22 due to abdominal pain for a week.  ? ?Underwent CT AP with on 4/17 which showed acute diverticulitis of sigmoid colon and left lateral sigmoid colon wall abscess. Patient was hospitalized for further eval and management, general surgery was consulted and patient has been treated with abx.  ?Repeat CT on 4/21 showed persistent intraabdominal abscess despite abx treatment.  ? ?IR was requested for image guided drain placement for the intraabdominal abscess.  ? ?Patient laying in bed, not in acute distress. Wife at bedside. ?Denise headache, fever, chills, shortness of breath, cough, chest pain, abdominal pain, nausea ,vomiting, and bleeding. ? ? ?Past Medical History:  ?Diagnosis Date  ? Chicken pox   ? Heart murmur   ? ? ?History reviewed. No pertinent surgical history. ? ?Allergies: ?Patient has no known allergies. ? ?Medications: ?Prior to Admission medications   ?Medication Sig Start Date End Date Taking? Authorizing Provider  ?amLODipine (NORVASC) 5 MG tablet Take 5 mg by mouth daily. 02/27/22  Yes [provider]  ?aspirin 81 MG tablet Take 81 mg by mouth 2 (two) times a week.   Yes [provider]  ?cyclobenzaprine (FLEXERIL) 10 MG tablet Take 10 mg by mouth 3 (three) times daily as needed for muscle spasms.   Yes [provider]  ?lisinopril (ZESTRIL) 20 MG tablet Take 20 mg by mouth daily. 02/27/22  Yes [provider]  ?TYLENOL PM EXTRA STRENGTH 500-25 MG TABS tablet Take 1 tablet by mouth at bedtime as needed.   Yes [provider]   ?HYDROcodone-acetaminophen (NORCO/VICODIN) 5-325 MG per tablet 1-2 tablets every 4-6 hours prn pain ?Patient not taking: Reported on 03/30/2022 09/14/12   Kristian Covey, MD  ?predniSONE (DELTASONE) 10 MG tablet 6-6-4-4-3-3-2-1 ?Patient not taking: Reported on 03/30/2022 09/14/12   Kristian Covey, MD  ?  ? ?Family History  ?Problem Relation Age of Onset  ? Leukemia Father 75  ? Diabetes Father   ? Cancer Father   ?     leukemia  ? ? ?Social History  ? ?Socioeconomic History  ? Marital status: Married  ?  Spouse name: Not on file  ? Number of children: Not on file  ? Years of education: Not on file  ? Highest education level: Not on file  ?Occupational History  ? Not on file  ?Tobacco Use  ? Smoking status: Former  ?  Packs/day: 0.50  ?  Years: 20.00  ?  Pack years: 10.00  ?  Types: Cigarettes  ?  Quit date: 12/15/2008  ?  Years since quitting: 13.3  ? Smokeless tobacco: Not on file  ?Substance and Sexual Activity  ? Alcohol use: Yes  ?  Comment: Occasionally  ? Drug use: Never  ? Sexual activity: Not on file  ?Other Topics Concern  ? Not on file  ?Social History Narrative  ? Not on file  ? ?Social Determinants of Health  ? ?Financial Resource Strain: Not on file  ?Food Insecurity: Not on file  ?Transportation Needs: Not on file  ?Physical Activity: Not on file  ?Stress: Not  on file  ?Social Connections: Not on file  ? ? ? ?Review of Systems: A 12 point ROS discussed and pertinent positives are indicated in the HPI above.  All other systems are negative. ? ?Vital Signs: ?BP 110/69 (BP Location: Right Arm)   Pulse 66   Temp 98.4 ?F (36.9 ?C) (Oral)   Resp 15   Ht 5\' 8"  (1.727 m)   Wt 179 lb 14.3 oz (81.6 kg)   SpO2 96%   BMI 27.35 kg/m?  ? ? ?Physical Exam ?Vitals reviewed.  ?Constitutional:   ?   General: He is not in acute distress. ?   Appearance: He is well-developed. He is not ill-appearing.  ?HENT:  ?   Head: Atraumatic.  ?Cardiovascular:  ?   Rate and Rhythm: Normal rate and regular rhythm.   ?Pulmonary:  ?   Effort: Pulmonary effort is normal.  ?   Breath sounds: Normal breath sounds.  ?Abdominal:  ?   General: Abdomen is flat.  ?   Palpations: Abdomen is soft.  ?Skin: ?   General: Skin is warm and dry.  ?Neurological:  ?   Mental Status: He is alert and oriented to person, place, and time.  ?Psychiatric:     ?   Mood and Affect: Mood normal.     ?   Behavior: Behavior normal.  ? ? ?MD Evaluation ?Airway: WNL ?Heart: WNL ?Abdomen: WNL ?Chest/ Lungs: WNL ?ASA  Classification: 2 ?Mallampati/Airway Score: Two ? ?Imaging: ?CT ABDOMEN PELVIS W CONTRAST ? ?Result Date: 03/30/2022 ?CLINICAL DATA:  Abdominal pain. LEFT lower quadrant pain radiating to the RIGHT side. EXAM: CT ABDOMEN AND PELVIS WITH CONTRAST TECHNIQUE: Multidetector CT imaging of the abdomen and pelvis was performed using the standard protocol following bolus administration of intravenous contrast. RADIATION DOSE REDUCTION: This exam was performed according to the departmental dose-optimization program which includes automated exposure control, adjustment of the mA and/or kV according to patient size and/or use of iterative reconstruction technique. CONTRAST:  52mL OMNIPAQUE IOHEXOL 300 MG/ML  SOLN COMPARISON:  None. FINDINGS: Lower chest: No acute abnormality. Hepatobiliary: No focal liver abnormality is seen. No gallstones, gallbladder wall thickening, or biliary dilatation. Pancreas: Unremarkable. No pancreatic ductal dilatation or surrounding inflammatory changes. Spleen: No acute findings. Chronic/dystrophic calcifications within the upper spleen. Adrenals/Urinary Tract: Adrenal glands appear normal. Kidneys are unremarkable without mass, stone or hydronephrosis. No perinephric fluid or inflammation. Bladder is decompressed. Walls of the upper bladder appear thickened. Stomach/Bowel: Prominent thickening of the walls of a 10 cm segment of the mid sigmoid colon, with extensive surrounding pericolonic inflammation/fluid stranding. Irregular  hypoechoic collection along the LEFT lateral wall of the affected portion of the sigmoid colon measures 2.8 x 1.2 cm, suspicious for early abscess within the wall of the sigmoid colon. More proximal portion of the large bowel is normal in caliber and without additional site of bowel wall inflammation. Appendix is normal. Small bowel is normal in caliber. Stomach is unremarkable, partially decompressed. Vascular/Lymphatic: Aortic atherosclerosis. No acute-appearing vascular abnormality. No enlarged lymph nodes are seen. Reproductive: Prostate is unremarkable. Other: Small amount of free fluid layering within the pelvis. No free intraperitoneal air. Musculoskeletal: No acute-appearing osseous abnormality. Mild degenerative spondylosis of the thoracic and lumbar spine. RIGHT inguinal hernia which contains fat only. IMPRESSION: 1. Acute diverticulitis of the sigmoid colon. Associated prominent thickening of the walls of the mid sigmoid colon (involving a 10 cm segment of the sigmoid colon), with extensive surrounding pericolonic inflammation/fluid stranding. 2. Suspected early/small abscess collection  within the LEFT lateral wall of the sigmoid colon, measuring 2.8 x 1.2 cm (series 2, image 69). Recommend close clinical and/or imaging follow-up. 3. Upper bladder walls appear thickened, likely reactive to the overlying sigmoid diverticulitis. 4. No associated bowel obstruction.  No free intraperitoneal air. 5. RIGHT inguinal hernia which contains fat only. Aortic Atherosclerosis (ICD10-I70.0). Electronically Signed   By: Bary RichardStan  Maynard M.D.   On: 03/30/2022 13:02   ? ?Labs: ? ?CBC: ?Recent Labs  ?  03/31/22 ?0422 04/01/22 ?0440 04/02/22 ?0412 04/03/22 ?0409  ?WBC 13.2* 12.6* 10.9* 11.8*  ?HGB 12.4* 12.5* 12.5* 12.4*  ?HCT 36.9* 36.7* 37.9* 37.4*  ?PLT 252 285 341 351  ? ? ?COAGS: ?Recent Labs  ?  03/30/22 ?1116  ?INR 1.0  ?APTT 35  ? ? ?BMP: ?Recent Labs  ?  03/30/22 ?1119 03/31/22 ?0422 04/01/22 ?0440 04/02/22 ?0412  ?NA  132* 135 134* 136  ?K 3.9 4.1 3.8 4.4  ?CL 95* 100 98 96*  ?CO2 25 28 27  32  ?GLUCOSE 113* 101* 114* 114*  ?BUN 14 14 8 8   ?CALCIUM 10.2 8.8* 8.7* 9.1  ?CREATININE 1.02 0.96 0.93 1.10  ?GFRNONAA >60 >60 >60 >60  ? ? ?LIVE

## 2022-04-03 NOTE — Assessment & Plan Note (Addendum)
CT 4/17 with diverticulitis of sigmoid colon, suspected early/small abscess collection within the left lateral wall of the sigmoid colon (2.8x1.2 cm), bladder wall thickening - likely reactive ?CT 4/21 with acute diverticulitis of the sigmoid colon, fluid collection of wall of sigmoid increasing in size.  Additional focal area of fluid and inflammatory change in the LLQ, concerning for developing abscess. ?4/21 IR placed CT guided 12 Fr drainage catheter into diverticular abscess  ?Follow cultures (pansensitive e. Coli, strep constellatus, bacteroides fragilis, bacteroides thetaiotaomicron - follow sensitivities) ?Transitioned to meropenem 4/21 -4/24.  Narrow to augmentin today.  Will discharge with augmentin x 14 days.   ?Low fiber diet, per surgery ?Appreciate surgery assistance - suspects pt would benefit from segmental colonic resection at some point.  Suspect he will develop colocutaneous fistula at abscess cavity in mesentery.  Surgery hoping to manage with abx/drainage with eventual surgical procedure in 6-8 weeks. ?Pain regimen per general surgery, bowel regimen ?Colonoscopy 6-8 weeks to r/o malignancy ?Needs to follow up outpatient in IR outpatient clinic in 10-14 days, flush drain once daily 5-10 ml NS, document daily output ? ? ? ? ?

## 2022-04-03 NOTE — Progress Notes (Signed)
MEDICATION-RELATED CONSULT NOTE  ? ?IR Procedure Consult - Anticoagulant/Antiplatelet PTA/Inpatient Med List Review by Pharmacist  ? ?Procedure: CT guided catheter placed into diverticular abscess ?   ?Completed: 4/21 @ 1720 ? ?Post-Procedural bleeding risk per IR MD assessment:   ? ?Antithrombotic medications on inpatient or PTA profile prior to procedure:   Lovenox 40mg  [place] q24 hours for DVT ppx ?  ? ?Recommended restart time per IR Post-Procedure Guidelines:  Next day (AM) ? ?Other considerations:    ? ? ?Plan:     ?Resume Lovenox 40mg  [place] q24 hours 4/22 @ 1000 ? ?Dimple Nanas, PharmD ?04/03/2022 5:33 PM ? ?

## 2022-04-03 NOTE — Assessment & Plan Note (Addendum)
Amlodipine, lisinopril ?

## 2022-04-03 NOTE — Procedures (Signed)
Pre procedural Dx: Diverticular abscess ?Post procedural Dx: Same ? ?Technically successful CT guided placed of a 12 Fr drainage catheter placement into the diverticular abscess within the left lower abdomen yielding 60 cc of purulent fluid.   ?Drain connected to gravity bag.  ? ?A representative aspirated sample was capped and sent to the laboratory for analysis.   ? ?EBL: Trace ?Complications: None immediate ? ?Ronny Bacon, MD ?Pager #: [phone] ? ? ?

## 2022-04-03 NOTE — Progress Notes (Signed)
? ? ?   ?Subjective: ?Feels his pain is slightly better because he is staying on top of his pain meds. Having some cramping pain and bloating after he eats. +flatus. Denies BM since admission. Denies fever chills nausea or vomiting.  ? ?ROS: See above, otherwise other systems negative ? ?Objective: ?Vital signs in last 24 hours: ?Temp:  [98.4 ?F (36.9 ?C)-99.6 ?F (37.6 ?C)] 98.4 ?F (36.9 ?C) (04/21 6503) ?Pulse Rate:  [65-70] 65 (04/21 5465) ?Resp:  [18] 18 (04/21 6812) ?BP: (107-131)/(65-82) 131/82 (04/21 7517) ?SpO2:  [97 %] 97 % (04/21 0017) ?Last BM Date : 03/27/22 ? ?Intake/Output from previous day: ?04/20 0701 - 04/21 0700 ?In: 1352.3 [P.O.:600; I.V.:602.4; IV Piggyback:149.9] ?Out: -  ?Intake/Output this shift: ?Total I/O ?In: 240 [P.O.:240] ?Out: 0  ? ?PE: ?Heart: regular ?Lungs: CTAB ?Abd: soft, palpation of RLQ causes pain in LLQ, very TTP LLQ without guarding. +BS, ND ? ?Lab Results:  ?Recent Labs  ?  04/02/22 ?0412 04/03/22 ?0409  ?WBC 10.9* 11.8*  ?HGB 12.5* 12.4*  ?HCT 37.9* 37.4*  ?PLT 341 351  ? ?BMET ?Recent Labs  ?  04/01/22 ?0440 04/02/22 ?0412  ?NA 134* 136  ?K 3.8 4.4  ?CL 98 96*  ?CO2 27 32  ?GLUCOSE 114* 114*  ?BUN 8 8  ?CREATININE 0.93 1.10  ?CALCIUM 8.7* 9.1  ? ?PT/INR ?No results for input(s): LABPROT, INR in the last 72 hours. ? ?CMP  ?   ?Component Value Date/Time  ? NA 136 04/02/2022 0412  ? K 4.4 04/02/2022 0412  ? CL 96 (L) 04/02/2022 0412  ? CO2 32 04/02/2022 0412  ? GLUCOSE 114 (H) 04/02/2022 0412  ? BUN 8 04/02/2022 0412  ? CREATININE 1.10 04/02/2022 0412  ? CALCIUM 9.1 04/02/2022 0412  ? PROT 6.5 04/01/2022 0440  ? ALBUMIN 3.2 (L) 04/01/2022 0440  ? AST 17 04/01/2022 0440  ? ALT 16 04/01/2022 0440  ? ALKPHOS 63 04/01/2022 0440  ? BILITOT 0.6 04/01/2022 0440  ? GFRNONAA >60 04/02/2022 0412  ? GFRAA 53 (L) 06/15/2019 0149  ? ?Lipase  ?   ?Component Value Date/Time  ? LIPASE <10 (L) 03/30/2022 1119  ? ? ? ? ? ?Studies/Results: ?No results found. ? ?Anti-infectives: ?Anti-infectives  (From admission, onward)  ? ? Start     Dose/Rate Route Frequency Ordered Stop  ? 03/30/22 2245  piperacillin-tazobactam (ZOSYN) IVPB 3.375 g       ?See Hyperspace for full Linked Orders Report.  ? 3.375 g ?12.5 mL/hr over 240 Minutes Intravenous Every 8 hours 03/30/22 1430 04/04/22 2244  ? 03/30/22 1445  piperacillin-tazobactam (ZOSYN) IVPB 3.375 g       ?See Hyperspace for full Linked Orders Report.  ? 3.375 g ?100 mL/hr over 30 Minutes Intravenous  Once 03/30/22 1430 03/30/22 1543  ? ?  ? ? ? ?Assessment/Plan ?Diverticulitis with small abscess and phlegmon ?-cont IV abx therapy ?-WBC 11.8 from 10. ?- repeat a CT a/p to evaluate fluid collection given persistent pain in LLQ.  ?- NPO until scan results. ?-mobilize ?-cont to follow  ? ?FEN - NPO, sips w/ meds ?VTE - Lovenox ?ID - zosyn ? ?I reviewed hospitalist notes, last 24 h vitals and pain scores, last 48 h intake and output, last 24 h labs and trends, and last 24 h imaging results. ? ? LOS: 4 days  ? ? ?Adam Phenix , PA-C ?[facility] ?04/03/2022, 10:52 AM ?Please see Amion for pager number during day hours 7:00am-4:30pm or 7:00am -  11:30am on weekends ? ?

## 2022-04-03 NOTE — Hospital Course (Addendum)
61 year old male with history of hypertension presents with 6-day history of abdominal pain, pain was located in left lower quadrant.  Also complained of constipation.  CT scan abdomen pelvis showed sigmoid diverticulitis with 2.8 x 1.2 cm small abscess within the left lateral wall of the sigmoid colon.  General surgery was consulted for IV antibiotics.  Patient started on IV Zosyn.  Repeat imaging on 4/21 showed increasing size of fluid collection of wall of sigmoid colon and additional areal of fluid and inflammatory change in LLQ, more organized when compared to prior exam concerning for abscess.  He's now s/p CT guided placement of 12 french all purpose drain catheter.  He's improving with plan for outpatient follow up with IR, general surgery, and gastroenterology. ? ?See below for additional details ?

## 2022-04-04 DIAGNOSIS — N179 Acute kidney failure, unspecified: Secondary | ICD-10-CM

## 2022-04-04 DIAGNOSIS — R6510 Systemic inflammatory response syndrome (SIRS) of non-infectious origin without acute organ dysfunction: Secondary | ICD-10-CM

## 2022-04-04 DIAGNOSIS — K5720 Diverticulitis of large intestine with perforation and abscess without bleeding: Secondary | ICD-10-CM | POA: Diagnosis not present

## 2022-04-04 DIAGNOSIS — D72829 Elevated white blood cell count, unspecified: Secondary | ICD-10-CM

## 2022-04-04 LAB — CBC WITH DIFFERENTIAL/PLATELET
Abs Immature Granulocytes: 0.21 10*3/uL — ABNORMAL HIGH (ref 0.00–0.07)
Basophils Absolute: 0.1 10*3/uL (ref 0.0–0.1)
Basophils Relative: 0 %
Eosinophils Absolute: 0 10*3/uL (ref 0.0–0.5)
Eosinophils Relative: 0 %
HCT: 38.9 % — ABNORMAL LOW (ref 39.0–52.0)
Hemoglobin: 12.6 g/dL — ABNORMAL LOW (ref 13.0–17.0)
Immature Granulocytes: 1 %
Lymphocytes Relative: 6 %
Lymphs Abs: 1.1 10*3/uL (ref 0.7–4.0)
MCH: 31.2 pg (ref 26.0–34.0)
MCHC: 32.4 g/dL (ref 30.0–36.0)
MCV: 96.3 fL (ref 80.0–100.0)
Monocytes Absolute: 1 10*3/uL (ref 0.1–1.0)
Monocytes Relative: 5 %
Neutro Abs: 16.4 10*3/uL — ABNORMAL HIGH (ref 1.7–7.7)
Neutrophils Relative %: 88 %
Platelets: 417 10*3/uL — ABNORMAL HIGH (ref 150–400)
RBC: 4.04 MIL/uL — ABNORMAL LOW (ref 4.22–5.81)
RDW: 12 % (ref 11.5–15.5)
WBC: 18.8 10*3/uL — ABNORMAL HIGH (ref 4.0–10.5)
nRBC: 0 % (ref 0.0–0.2)

## 2022-04-04 LAB — CULTURE, BLOOD (ROUTINE X 2)
Culture: NO GROWTH
Culture: NO GROWTH
Special Requests: ADEQUATE
Special Requests: ADEQUATE

## 2022-04-04 LAB — COMPREHENSIVE METABOLIC PANEL
ALT: 28 U/L (ref 0–44)
AST: 30 U/L (ref 15–41)
Albumin: 3.2 g/dL — ABNORMAL LOW (ref 3.5–5.0)
Alkaline Phosphatase: 64 U/L (ref 38–126)
Anion gap: 10 (ref 5–15)
BUN: 14 mg/dL (ref 6–20)
CO2: 26 mmol/L (ref 22–32)
Calcium: 8.8 mg/dL — ABNORMAL LOW (ref 8.9–10.3)
Chloride: 95 mmol/L — ABNORMAL LOW (ref 98–111)
Creatinine, Ser: 1.25 mg/dL — ABNORMAL HIGH (ref 0.61–1.24)
GFR, Estimated: >60 mL/min (ref >60)
Glucose, Bld: 123 mg/dL — ABNORMAL HIGH (ref 70–99)
Potassium: 4.5 mmol/L (ref 3.5–5.1)
Sodium: 131 mmol/L — ABNORMAL LOW (ref 135–145)
Total Bilirubin: 0.6 mg/dL (ref 0.3–1.2)
Total Protein: 6.9 g/dL (ref 6.5–8.1)

## 2022-04-04 LAB — MAGNESIUM: Magnesium: 1.9 mg/dL (ref 1.7–2.4)

## 2022-04-04 LAB — PHOSPHORUS: Phosphorus: 4.8 mg/dL — ABNORMAL HIGH (ref 2.5–4.6)

## 2022-04-04 MED ORDER — KETOROLAC TROMETHAMINE 15 MG/ML IJ SOLN
15.0000 mg | Freq: Three times a day (TID) | INTRAMUSCULAR | Status: DC
Start: 2022-04-04 — End: 2022-04-05
  Administered 2022-04-04 – 2022-04-05 (×4): 15 mg via INTRAVENOUS
  Filled 2022-04-04 (×4): qty 1

## 2022-04-04 MED ORDER — METHOCARBAMOL 1000 MG/10ML IJ SOLN
500.0000 mg | Freq: Three times a day (TID) | INTRAMUSCULAR | Status: DC
  Administered 2022-04-04 – 2022-04-05 (×4): 500 mg via INTRAVENOUS
  Filled 2022-04-04 (×4): qty 500

## 2022-04-04 MED ORDER — HYDROMORPHONE HCL 1 MG/ML IJ SOLN: 0.5000 mg | INTRAMUSCULAR | Status: DC | PRN

## 2022-04-04 MED ORDER — LACTATED RINGERS IV SOLN: INTRAVENOUS | Status: DC

## 2022-04-04 MED ORDER — ACETAMINOPHEN 500 MG PO TABS
1000.0000 mg | ORAL_TABLET | Freq: Four times a day (QID) | ORAL | Status: DC
  Administered 2022-04-04 – 2022-04-07 (×13): 1000 mg via ORAL
  Filled 2022-04-04 (×13): qty 2

## 2022-04-04 NOTE — Plan of Care (Signed)

## 2022-04-04 NOTE — Assessment & Plan Note (Addendum)
Afebrile today ?Follow on abx ?improving ?

## 2022-04-04 NOTE — Progress Notes (Addendum)
?PROGRESS NOTE ? ? ? ?Jim Moran  SEG:[id number] DOB: 1961/08/21 DOA: 03/30/2022 ?PCP: Jim Pee, PA  ?Chief Complaint  ?Patient presents with  ? Abdominal Pain  ? ? ?Brief Narrative:  ?61 year old male with history of hypertension presents with 6-day history of abdominal pain, pain was located in left lower quadrant.  Also complained of constipation.  CT scan abdomen pelvis showed sigmoid diverticulitis with 2.8 x 1.2 cm small abscess within the left lateral wall of the sigmoid colon.  General surgery was consulted for IV antibiotics.  Patient started on IV Zosyn.  ? ? ?Assessment & Plan: ?  ?Principal Problem: ?  Abscess of sigmoid colon due to diverticulitis ?Active Problems: ?  SIRS (systemic inflammatory response syndrome) (HCC) ?  History of adenomatous polyp of colon ?  AKI (acute kidney injury) (HCC) ?  Leukocytosis ?  Hypertension, essential ?  Diverticulitis of sigmoid colon with abscess ?  Diverticulitis ? ? ?Assessment and Plan: ?* Abscess of sigmoid colon due to diverticulitis ?CT 4/17 with diverticulitis of sigmoid colon, suspected early/small abscess collection within the left lateral wall of the sigmoid colon (2.8x1.2 cm), bladder wall thickening - likely reactive ?CT 4/21 with acute diverticulitis of the sigmoid colon, fluid collection of wall of sigmoid increasing in size.  Additional focal area of fluid and inflammatory change in the LLQ, concerning for developing abscess. ?4/21 IR placed CT guided 12 Fr drainage catheter into diverticular abscess  ?Follow cultures (gram positive cocci, rare gram neg rods) ?Transitioned to meropenem 4/21 ?Clear liquid diet, per surgery ?Appreciate surgery assistance - suspects pt would benefit from segmental colonic resection at some point.  Suspect he will develop colocutaneous fistula at abscess cavity in mesentery.  Surgery hoping to manage with abx/drainage with eventual surgical procedure in 6-8 weeks. ?Pain regimen per general surgery, bowel  regimen ?Colonoscopy 6-8 weeks to r/o malignancy ? ? ? ? ? ?SIRS (systemic inflammatory response syndrome) (HCC) ?Fever overnight with leukocytosis, related to above with drain placement ?Follow on abx ?Consider blood cultures if recurrent ? ?Leukocytosis ?Related to above ? ?AKI (acute kidney injury) (HCC) ?Mild, IVF ?follow ? ?Hypertension, essential ?Amlodipine, lisinopril ? ? ?DVT prophylaxis: lovenox ?Code Status: full ?Family Communication: none ?Disposition:  ? ?Status is: Inpatient ?Remains inpatient appropriate because: need for IV abx, surgical clearance ?  ?Consultants:  ?Surgery ?IR ? ?Procedures:  ?CT guided placement of Krysta Bloom[place] 12 Fr drainage catheter placement into the diverticular abscess 4/21 ? ?Antimicrobials:  ?Anti-infectives (From admission, onward)  ? ? Start     Dose/Rate Route Frequency Ordered Stop  ? 04/03/22 1600  ertapenem (INVANZ) 1,000 mg in sodium chloride 0.9 % 100 mL IVPB       ? 1 g ?200 mL/hr over 30 Minutes Intravenous Every 24 hours 04/03/22 1511    ? 03/30/22 2245  piperacillin-tazobactam (ZOSYN) IVPB 3.375 g  Status:  Discontinued       ?See Hyperspace for full Linked Orders Report.  ? 3.375 g ?12.5 mL/hr over 240 Minutes Intravenous Every 8 hours 03/30/22 1430 04/03/22 1511  ? 03/30/22 1445  piperacillin-tazobactam (ZOSYN) IVPB 3.375 g       ?See Hyperspace for full Linked Orders Report.  ? 3.375 g ?100 mL/hr over 30 Minutes Intravenous  Once 03/30/22 1430 03/30/22 1543  ? ?  ? ? ?Subjective: ?Complains of worsening pain after procedure today ?Worse pain to LLQ, more difficulty mobilizing ? ?Objective: ?Vitals:  ? 04/03/22 1710 04/03/22 1742 04/03/22 2311 04/04/22 0526  ?BP: 122/64 129/78  120/70 103/75  ?Pulse: 71 71 100 82  ?Resp: 15 15 20    ?Temp:  98.2 ?F (36.8 ?C) (!) 100.7 ?F (38.2 ?C) 98.1 ?F (36.7 ?C)  ?TempSrc:  Oral Oral Oral  ?SpO2: 97% 94% 96% 93%  ?Weight:      ?Height:      ? ? ?Intake/Output Summary (Last 24 hours) at 04/04/2022 0854 ?Last data filed at 04/04/2022  0707 ?Gross per 24 hour  ?Intake 365 ml  ?Output 110 ml  ?Net 255 ml  ? ?Filed Weights  ? 03/30/22 1110  ?Weight: 81.6 kg  ? ? ?Examination: ? ?General exam: Appears calm and comfortable  ?Respiratory system: unlabored ?Cardiovascular system: RRR ?Gastrointestinal system: diffuse mild TTP, most in LLQ, drain with bloody drainage ?Central nervous system: Alert and oriented. No focal neurological deficits. ?Extremities: no LEE ? ? ? ?Data Reviewed: I have personally reviewed following labs and imaging studies ? ?CBC: ?Recent Labs  ?Lab 03/31/22 ?0422 04/01/22 ?0440 04/02/22 ?[id number] 04/03/22 ?0409 04/04/22 ?0407  ?WBC 13.2* 12.6* 10.9* 11.8* 18.8*  ?NEUTROABS  --   --   --   --  16.4*  ?HGB 12.4* 12.5* 12.5* 12.4* 12.6*  ?HCT 36.9* 36.7* 37.9* 37.4* 38.9*  ?MCV 97.9 96.1 97.4 97.1 96.3  ?PLT 252 285 341 351 417*  ? ? ?Basic Metabolic Panel: ?Recent Labs  ?Lab 03/30/22 ?1119 03/31/22 ?0422 04/01/22 ?0440 04/02/22 ?[id number] 04/04/22 ?0407  ?NA 132* 135 134* 136 131*  ?K 3.9 4.1 3.8 4.4 4.5  ?CL 95* 100 98 96* 95*  ?CO2 25 28 27  32 26  ?GLUCOSE 113* 101* 114* 114* 123*  ?BUN 14 14 8 8 14   ?CREATININE 1.02 0.96 0.93 1.10 1.25*  ?CALCIUM 10.2 8.8* 8.7* 9.1 8.8*  ?MG  --   --   --   --  1.9  ?PHOS  --   --   --   --  4.8*  ? ? ?GFR: ?Estimated Creatinine Clearance: 62.3 mL/min ([name]) (by C-G formula based on SCr of 1.25 mg/dL (H)). ? ?Liver Function Tests: ?Recent Labs  ?Lab 03/30/22 ?1119 03/31/22 ?0422 04/01/22 ?0440 04/04/22 ?0407  ?AST 17 16 17 30   ?ALT 16 15 16 28   ?ALKPHOS 73 62 63 64  ?BILITOT 0.5 0.3 0.6 0.6  ?PROT 8.3* 7.0 6.5 6.9  ?ALBUMIN 4.6 3.5 3.2* 3.2*  ? ? ?CBG: ?No results for input(s): GLUCAP in the last 168 hours. ? ? ?Recent Results (from the past 240 hour(s))  ?Urine Culture     Status: None  ? Collection Time: 03/30/22 11:19 AM  ? Specimen: In/Out Cath Urine  ?Result Value Ref Range Status  ? Specimen Description   Final  ?  IN/OUT CATH URINE ?Performed at [facility], [address],  [place], [place] [address] ?  ? Special Requests   Final  ?  NONE ?Performed at [facility], [address], [place], [place] [address] ?  ? Culture   Final  ?  NO GROWTH ?Performed at [facility], [address] [place], [place] [address] ?  ? Report Status 03/31/2022 FINAL  Final  ?Resp Panel by RT-PCR (Flu Jaqwon Manfred&B, Covid) Nasopharyngeal Swab     Status: None  ? Collection Time: 03/30/22  2:49 PM  ? Specimen: Nasopharyngeal Swab; Nasopharyngeal(NP) swabs in vial transport medium  ?Result Value Ref Range Status  ? SARS Coronavirus 2 by RT PCR NEGATIVE NEGATIVE Final  ?  Comment: (NOTE) ?SARS-CoV-2 target nucleic acids are NOT DETECTED. ? ?  The SARS-CoV-2 RNA is generally detectable in upper respiratory ?specimens during the acute phase of infection. The lowest ?concentration of SARS-CoV-2 viral copies this assay can detect is ?138 copies/mL. Tameka Hoiland negative result does not preclude SARS-Cov-2 ?infection and should not be used as the sole basis for treatment or ?other patient management decisions. Artha Stavros negative result may occur with  ?improper specimen collection/handling, submission of specimen other ?than nasopharyngeal swab, presence of viral mutation(s) within the ?areas targeted by this assay, and inadequate number of viral ?copies(<138 copies/mL). Davarious Tumbleson negative result must be combined with ?clinical observations, patient history, and epidemiological ?information. The expected result is Negative. ? ?Fact Sheet for Patients:  ?BloggerCourse.com ? ?Fact Sheet for Healthcare Providers:  ?SeriousBroker.it ? ?This test is no t yet approved or cleared by the [place] FDA and  ?has been authorized for detection and/or diagnosis of SARS-CoV-2 by ?FDA under an Emergency Use Authorization (EUA). This EUA will remain  ?in effect (meaning this test can be used) for the duration of the ?COVID-19 declaration under Section 564(b)(1) of the Act, 21 ?U.S.C.section  360bbb-3(b)(1), unless the authorization is terminated  ?or revoked sooner.  ? ? ?  ? Influenza Noga Fogg by PCR NEGATIVE NEGATIVE Final  ? Influenza B by PCR NEGATIVE NEGATIVE Final  ?  Comment: (NOTE) ?The Xpert Xpress SARS-

## 2022-04-04 NOTE — Assessment & Plan Note (Addendum)
improved

## 2022-04-04 NOTE — Progress Notes (Addendum)
? ?Jim Moran ?[id number] ?July 08, 1961 ? ?CARE TEAM: ? ?PCP: Roderick Pee, PA ? ?Outpatient Care Team: Patient Care Team: ?Roderick Pee, PA as PCP - General (Family Medicine) ?Farris Has, MD as Referring Physician (Family Medicine) ?Vida Rigger, MD as Consulting Physician (Gastroenterology) ? ?Inpatient Treatment Team: Treatment Team: Attending Provider: Zigmund Daniel., MD; Consulting Physician: Montez Morita, Md, MD; Rounding Team: Alton Revere, MD; Rounding Team: [facility], MD; Registered Nurse: Hurshel Party, RN; Utilization Review: Clydia Llano, RN; Technician: Jamesetta Orleans, NT; Registered Nurse: Dina Rich, RN ? ? ?Problem List:  ? ?Principal Problem: ?  Abscess of sigmoid colon due to diverticulitis ?Active Problems: ?  Hypertension, essential ?  Diverticulitis of sigmoid colon with abscess ?  History of adenomatous polyp of colon ?  Diverticulitis ? ? ?   * No surgery found * ? ? ? ? ? ?Assessment ? ?Sigmoid diverticulitis with phlegmon and progression of abscess despite appropriate antibiotics. ?(Hospital Stay = 5 days) ? ?Plan ?Diverticulitis with small abscess and phlegmon ?-Persistent pain despite appropriate IV therapy.  CT scan showed more consistent abscess formation.  Discussed with interventional radiology they were to place a drain Friday evening 4/21 with 60 mL of pus aspirated.   ?-WBC rise.  Most likely stress from procedure.  Follow and see if it will fall down further.   ?-Improved pain control.  Scheduled Robaxin ketorolac and acetaminophen.  Switch from morphine to hydromorphone since getting hallucinations with morphine.  Hold oral narcotics with possible ileus now. ?-Go down to clear liquids for now. ?-mobilize ?-cont to follow  ? ?Strongly suspect patient would benefit from segmental colonic resection at some point.  Suspect he will develop a delayed colocutaneous fistula at the abscess cavity in the mesentery.  See if we can control  this attack with drainage antibiotic adjustment and limpness along until we can do a 1 stage procedure with immediate anastomosis.  Robotic resection.  Most likely would happen in June, 6-8 weeks from this drain placement ?  ?FEN - NPO, sips w/ meds ?VTE - Lovenox ?ID - zosyn -4/21.  Ertapenem 4/21-.  Follow-up on drain cultures to rule out atypical organisms.  No obvious yeast.  Numerous organisms so far. ?  ? ?Disposition:  ?Disposition:  ?The patient is from: Home ? ?Anticipate discharge to:  Home ? ?Anticipated Date of Discharge is:  April 24,2023 ?  ? ?Barriers to discharge:  Pending Clinical improvement (more likely than not) ? ?Patient currently is NOT MEDICALLY STABLE for discharge from the hospital from a surgery standpoint. ? ? ? ? ? ?I reviewed nursing notes, hospitalist notes, last 24 h vitals and pain scores, last 48 h intake and output, last 24 h labs and trends, and last 24 h imaging results. I have reviewed this patient's available data, including medical history, events of note, test results, etc as part of my evaluation.  A significant portion of that time was spent in counseling.  Care during the described time interval was provided by me. ? ?This care required moderate level of medical decision making.  04/04/2022 ? ? ? ?Subjective: ?(Chief complaint) ? ?Patient with worsening soreness at drain site. ? ?Tried morphine but made him somewhat loopy. ? ?No nausea or vomiting. ? ?Objective: ? ?Vital signs: ? ?Vitals:  ? 04/03/22 1710 04/03/22 1742 04/03/22 2311 04/04/22 0526  ?BP: 122/64 129/78 120/70 103/75  ?Pulse: 71 71 100 82  ?Resp: 15 15 20    ?Temp:  98.2 ?  F (36.8 ?C) (!) 100.7 ?F (38.2 ?C) 98.1 ?F (36.7 ?C)  ?TempSrc:  Oral Oral Oral  ?SpO2: 97% 94% 96% 93%  ?Weight:      ?Height:      ? ? ?Last BM Date : 03/27/22 ? ?Intake/Output  ? ?Yesterday: ? 04/21 0701 - 04/22 0700 ?In: 365 [P.O.:360; I.V.:5] ?Out: 42 [Drains:70] ?This shift: ? Total I/O ?In: 0  ?Out: 40 [Drains:40] ? ?Bowel  function: ? Flatus: No ? BM:  No ? Drain:  Dark reddish-brown.  Most likely consistent with draining hematoma at abscess cavity ? ? ?Physical Exam: ? ?General: Pt awake/alert in mild acute distress ?Eyes: PERRL, normal EOM.  Sclera clear.  No icterus ?Neuro: CN II-XII intact w/o focal sensory/motor deficits. ?Lymph: No head/neck/groin lymphadenopathy ?Psych:  No delerium/psychosis/paranoia.  Oriented x 4 ?HENT: Normocephalic, Mucus membranes moist.  No thrush ?Neck: Supple, No tracheal deviation.  No obvious thyromegaly ?Chest: No pain to chest wall compression.  Good respiratory excursion.  No audible wheezing ?CV:  Pulses intact.  Regular rhythm.  No major extremity edema ?MS: Normal AROM mjr joints.  No obvious deformity ? ?Abdomen: Somewhat firm.  Moderately distended.  Tenderness at LLQ only - esp drain site .  No evidence of peritonitis.  No incarcerated hernias. ? ?Ext:   No deformity.  No mjr edema.  No cyanosis ?Skin: No petechiae / purpurea.  No major sores.  Warm and dry ? ? ? ?Results:  ? ?Cultures: ?Recent Results (from the past 720 hour(s))  ?Urine Culture     Status: None  ? Collection Time: 03/30/22 11:19 AM  ? Specimen: In/Out Cath Urine  ?Result Value Ref Range Status  ? Specimen Description   Final  ?  IN/OUT CATH URINE ?Performed at [facility], [address] [place], [place] [address] ?  ? Special Requests   Final  ?  NONE ?Performed at [facility], [address], [place], [place] [address] ?  ? Culture   Final  ?  NO GROWTH ?Performed at [facility], [address], [place], [place] [address] ?  ? Report Status 03/31/2022 FINAL  Final  ?Resp Panel by RT-PCR (Flu A&B, Covid) Nasopharyngeal Swab     Status: None  ? Collection Time: 03/30/22  2:49 PM  ? Specimen: Nasopharyngeal Swab; Nasopharyngeal(NP) swabs in vial transport medium  ?Result Value Ref Range Status  ? SARS Coronavirus 2 by RT PCR NEGATIVE NEGATIVE Final  ?  Comment:  (NOTE) ?SARS-CoV-2 target nucleic acids are NOT DETECTED. ? ?The SARS-CoV-2 RNA is generally detectable in upper respiratory ?specimens during the acute phase of infection. The lowest ?concentration of SARS-CoV-2 viral copies this assay can detect is ?138 copies/mL. A negative result does not preclude SARS-Cov-2 ?infection and should not be used as the sole basis for treatment or ?other patient management decisions. A negative result may occur with  ?improper specimen collection/handling, submission of specimen other ?than nasopharyngeal swab, presence of viral mutation(s) within the ?areas targeted by this assay, and inadequate number of viral ?copies(<138 copies/mL). A negative result must be combined with ?clinical observations, patient history, and epidemiological ?information. The expected result is Negative. ? ?Fact Sheet for Patients:  ?BloggerCourse.com ? ?Fact Sheet for Healthcare Providers:  ?SeriousBroker.it ? ?This test is no t yet approved or cleared by the [place] FDA and  ?has been authorized for detection and/or diagnosis of SARS-CoV-2 by ?FDA under an Emergency Use Authorization (EUA). This EUA will remain  ?in  effect (meaning this test can be used) for the duration of the ?COVID-19 declaration under Section 564(b)(1) of the Act, 21 ?U.S.C.section 360bbb-3(b)(1), unless the authorization is terminated  ?or revoked sooner.  ? ? ?  ? Influenza A by PCR NEGATIVE NEGATIVE Final  ? Influenza B by PCR NEGATIVE NEGATIVE Final  ?  Comment: (NOTE) ?The Xpert Xpress SARS-CoV-2/FLU/RSV plus assay is intended as an aid ?in the diagnosis of influenza from Nasopharyngeal swab specimens and ?should not be used as a sole basis for treatment. Nasal washings and ?aspirates are unacceptable for Xpert Xpress SARS-CoV-2/FLU/RSV ?testing. ? ?Fact Sheet for Patients: ?BloggerCourse.comhttps://www.fda.gov/media/152166/download ? ?Fact Sheet for Healthcare  Providers: ?SeriousBroker.ithttps://www.fda.gov/media/152162/download ? ?This test is not yet approved or cleared by the [place] FDA and ?has been authorized for detection and/or diagnosis of SARS-CoV-2 by ?FDA under an Emergency Use Authorization (EUA). This EUA will

## 2022-04-04 NOTE — Assessment & Plan Note (Addendum)
Improved, follow outpatient ?  ?

## 2022-04-05 DIAGNOSIS — K5720 Diverticulitis of large intestine with perforation and abscess without bleeding: Secondary | ICD-10-CM | POA: Diagnosis not present

## 2022-04-05 LAB — COMPREHENSIVE METABOLIC PANEL
ALT: 23 U/L (ref 0–44)
AST: 23 U/L (ref 15–41)
Albumin: 2.9 g/dL — ABNORMAL LOW (ref 3.5–5.0)
Alkaline Phosphatase: 62 U/L (ref 38–126)
Anion gap: 8 (ref 5–15)
BUN: 17 mg/dL (ref 6–20)
CO2: 27 mmol/L (ref 22–32)
Calcium: 8.7 mg/dL — ABNORMAL LOW (ref 8.9–10.3)
Chloride: 98 mmol/L (ref 98–111)
Creatinine, Ser: 1.07 mg/dL (ref 0.61–1.24)
GFR, Estimated: >60 mL/min (ref >60)
Glucose, Bld: 112 mg/dL — ABNORMAL HIGH (ref 70–99)
Potassium: 4.3 mmol/L (ref 3.5–5.1)
Sodium: 133 mmol/L — ABNORMAL LOW (ref 135–145)
Total Bilirubin: 0.5 mg/dL (ref 0.3–1.2)
Total Protein: 6.4 g/dL — ABNORMAL LOW (ref 6.5–8.1)

## 2022-04-05 LAB — CBC
HCT: 35.7 % — ABNORMAL LOW (ref 39.0–52.0)
Hemoglobin: 12.3 g/dL — ABNORMAL LOW (ref 13.0–17.0)
MCH: 33.2 pg (ref 26.0–34.0)
MCHC: 34.5 g/dL (ref 30.0–36.0)
MCV: 96.2 fL (ref 80.0–100.0)
Platelets: 404 10*3/uL — ABNORMAL HIGH (ref 150–400)
RBC: 3.71 MIL/uL — ABNORMAL LOW (ref 4.22–5.81)
RDW: 12.1 % (ref 11.5–15.5)
WBC: 14.7 10*3/uL — ABNORMAL HIGH (ref 4.0–10.5)
nRBC: 0 % (ref 0.0–0.2)

## 2022-04-05 LAB — MAGNESIUM: Magnesium: 2.1 mg/dL (ref 1.7–2.4)

## 2022-04-05 LAB — DIFFERENTIAL
Abs Immature Granulocytes: 0.18 10*3/uL — ABNORMAL HIGH (ref 0.00–0.07)
Basophils Absolute: 0 10*3/uL (ref 0.0–0.1)
Basophils Relative: 0 %
Eosinophils Absolute: 0.2 10*3/uL (ref 0.0–0.5)
Eosinophils Relative: 1 %
Immature Granulocytes: 1 %
Lymphocytes Relative: 6 %
Lymphs Abs: 0.9 10*3/uL (ref 0.7–4.0)
Monocytes Absolute: 1 10*3/uL (ref 0.1–1.0)
Monocytes Relative: 7 %
Neutro Abs: 12.3 10*3/uL — ABNORMAL HIGH (ref 1.7–7.7)
Neutrophils Relative %: 85 %

## 2022-04-05 LAB — PHOSPHORUS: Phosphorus: 3.6 mg/dL (ref 2.5–4.6)

## 2022-04-05 MED ORDER — HYDROMORPHONE HCL 1 MG/ML IJ SOLN: 1.0000 mg | INTRAMUSCULAR | Status: DC | PRN

## 2022-04-05 MED ORDER — CYCLOBENZAPRINE HCL 10 MG PO TABS
10.0000 mg | ORAL_TABLET | Freq: Three times a day (TID) | ORAL | Status: DC
  Administered 2022-04-05 – 2022-04-07 (×7): 10 mg via ORAL
  Filled 2022-04-05 (×7): qty 1

## 2022-04-05 MED ORDER — SODIUM CHLORIDE 0.9 % IV SOLN: 250.0000 mL | INTRAVENOUS | Status: DC | PRN

## 2022-04-05 MED ORDER — LACTATED RINGERS IV BOLUS: 1000.0000 mL | Freq: Three times a day (TID) | INTRAVENOUS | Status: AC | PRN

## 2022-04-05 MED ORDER — SODIUM CHLORIDE 0.9% FLUSH: 3.0000 mL | INTRAVENOUS | Status: DC | PRN

## 2022-04-05 MED ORDER — KETOROLAC TROMETHAMINE 15 MG/ML IJ SOLN
15.0000 mg | Freq: Four times a day (QID) | INTRAMUSCULAR | Status: DC
  Administered 2022-04-05 – 2022-04-07 (×8): 15 mg via INTRAVENOUS
  Filled 2022-04-05 (×8): qty 1

## 2022-04-05 MED ORDER — SODIUM CHLORIDE 0.9% FLUSH
3.0000 mL | Freq: Two times a day (BID) | INTRAVENOUS | Status: DC
  Administered 2022-04-05 – 2022-04-07 (×5): 3 mL via INTRAVENOUS

## 2022-04-05 NOTE — Plan of Care (Signed)
  Problem: Education: Goal: Knowledge of General Education information will improve Description Including pain rating scale, medication(s)/side effects and non-pharmacologic comfort measures Outcome: Progressing   Problem: Nutrition: Goal: Adequate nutrition will be maintained Outcome: Progressing   Problem: Pain Managment: Goal: General experience of comfort will improve Outcome: Progressing   

## 2022-04-05 NOTE — Progress Notes (Signed)
? ? ?Referring Physician(s): ?Gross,S ? ?Supervising Physician: Gilmer Mor ? ?Patient Status:  [facility] - In-pt ? ?Chief Complaint: ? ?Abdominal pain/abscess ? ?Subjective: ?Patient feeling a little bit better today, still has some continued discomfort near left lower quadrant drain.  Denies fever, respiratory problems, nausea, vomiting. ? ? ?Allergies: ?Morphine ? ?Medications: ?Prior to Admission medications   ?Medication Sig Start Date End Date Taking? Authorizing Provider  ?amLODipine (NORVASC) 5 MG tablet Take 5 mg by mouth daily. 02/27/22  Yes [provider]  ?aspirin 81 MG tablet Take 81 mg by mouth 2 (two) times a week.   Yes [provider]  ?cyclobenzaprine (FLEXERIL) 10 MG tablet Take 10 mg by mouth 3 (three) times daily as needed for muscle spasms.   Yes [provider]  ?lisinopril (ZESTRIL) 20 MG tablet Take 20 mg by mouth daily. 02/27/22  Yes [provider]  ?TYLENOL PM EXTRA STRENGTH 500-25 MG TABS tablet Take 1 tablet by mouth at bedtime as needed.   Yes [provider]  ?HYDROcodone-acetaminophen (NORCO/VICODIN) 5-325 MG per tablet 1-2 tablets every 4-6 hours prn pain ?Patient not taking: Reported on 03/30/2022 09/14/12   Kristian Covey, MD  ?predniSONE (DELTASONE) 10 MG tablet 6-6-4-4-3-3-2-1 ?Patient not taking: Reported on 03/30/2022 09/14/12   Kristian Covey, MD  ? ? ? ?Vital Signs: ?BP 132/76 (BP Location: Left Arm)   Pulse 83   Temp 98.6 ?F (37 ?C) (Oral)   Resp 16   Ht 5\' 8"  (1.727 m)   Wt 179 lb 14.3 oz (81.6 kg)   SpO2 94%   BMI 27.35 kg/m?  ? ?Physical Exam awake, alert.  Left lower quadrant drain intact, insertion site okay, mildly tender to palpation, output 50 cc bloody fluid; drain irrigated without difficulty ? ?Imaging: ?CT ABDOMEN PELVIS W CONTRAST ? ?Result Date: 04/03/2022 ?CLINICAL DATA:  Follow-up diverticulitis EXAM: CT ABDOMEN AND PELVIS WITH CONTRAST TECHNIQUE: Multidetector CT imaging of the abdomen and pelvis was  performed using the standard protocol following bolus administration of intravenous contrast. RADIATION DOSE REDUCTION: This exam was performed according to the departmental dose-optimization program which includes automated exposure control, adjustment of the mA and/or kV according to patient size and/or use of iterative reconstruction technique. CONTRAST:  04/05/2022 OMNIPAQUE IOHEXOL 300 MG/ML  SOLN COMPARISON:  CT abdomen and pelvis dated March 30, 2022 FINDINGS: Lower chest: No acute abnormality.  Coronary artery calcifications. Hepatobiliary: No focal liver abnormality is seen. No gallstones, gallbladder wall thickening, or biliary dilatation. Pancreas: Unremarkable. No pancreatic ductal dilatation or surrounding inflammatory changes. Spleen: Splenic calcifications, unchanged compared to prior exam. Normal size. Adrenals/Urinary Tract: Adrenal glands are unremarkable. Kidneys are normal, without renal calculi, focal lesion, or hydronephrosis. Wall thickening of the bladder dome, likely reactive. Stomach/Bowel: Marked wall thickening of the sigmoid colon and surrounding inflammatory change. Fluid collection of the wall the sigmoid colon is increased in size measuring 2.7 x 1.9 cm, previously 2.8 x 0.2 cm. Fluid inflammatory change surrounding the sigmoid colon with a focal area in the left lower quadrant which appears more organized when compared with prior exam measuring 5.4 x 3.7 cm. Normal appendix. No evidence of obstruction. Vascular/Lymphatic: Aortic atherosclerosis. No enlarged abdominal or pelvic lymph nodes. Reproductive: Prostate is unremarkable. Other: Moderate fat containing right inguinal hernia. No intra-abdominal free air. Musculoskeletal: No acute or significant osseous findings. IMPRESSION: 1. Acute diverticulitis of the sigmoid colon.Fluid collection of the wall the sigmoid colon is increased in size measuring 2.7 x 1.9 cm, previously 2.8  x 1.2 cm. Additional focal area of fluid and inflammatory  change is seen in the left lower quadrant which appears more organized when compared with prior exam measuring 5.4 x 3.7 cm, concerning for developing abscess. 2. Wall thickening of the bladder dome, likely reactive. 3.  Aortic Atherosclerosis (ICD10-I70.0). Electronically Signed   By: Allegra Lai M.D.   On: 04/03/2022 15:20   ? ?Labs: ? ?CBC: ?Recent Labs  ?  04/02/22 ?0412 04/03/22 ?0409 04/04/22 ?0407 04/05/22 ?0420  ?WBC 10.9* 11.8* 18.8* 14.7*  ?HGB 12.5* 12.4* 12.6* 12.3*  ?HCT 37.9* 37.4* 38.9* 35.7*  ?PLT 341 351 417* 404*  ? ? ?COAGS: ?Recent Labs  ?  03/30/22 ?1116  ?INR 1.0  ?APTT 35  ? ? ?BMP: ?Recent Labs  ?  04/01/22 ?0440 04/02/22 ?0412 04/04/22 ?0407 04/05/22 ?0420  ?NA 134* 136 131* 133*  ?K 3.8 4.4 4.5 4.3  ?CL 98 96* 95* 98  ?CO2 27 32 26 27  ?GLUCOSE 114* 114* 123* 112*  ?BUN 8 8 14 17   ?CALCIUM 8.7* 9.1 8.8* 8.7*  ?CREATININE 0.93 1.10 1.25* 1.07  ?GFRNONAA >60 >60 >60 >60  ? ? ?LIVER FUNCTION TESTS: ?Recent Labs  ?  03/31/22 ?0422 04/01/22 ?0440 04/04/22 ?0407 04/05/22 ?0420  ?BILITOT 0.3 0.6 0.6 0.5  ?AST 16 17 30 23   ?ALT 15 16 28 23   ?ALKPHOS 62 63 64 62  ?PROT 7.0 6.5 6.9 6.4*  ?ALBUMIN 3.5 3.2* 3.2* 2.9*  ? ? ?Assessment and Plan: ?Pt with history of sigmoid diverticulitis with associated abscess, status post left lower quadrant drain placement on 4/21; afebrile, drain fluid cultures pending, WBC 10.7 down from 1.8, hemoglobin stable at 12.3, creatinine normal ? ?Drain Location: LLQ ?Size: Fr size: 12 Fr ?Date of placement: 04/03/22  ?Currently to: Drain collection device: gravity ?24 hour output:  ?Output by Drain (mL) 04/03/22 0701 - 04/03/22 1900 04/03/22 1901 - 04/04/22 0700 04/04/22 0701 - 04/04/22 1900 04/04/22 1901 - 04/05/22 0700 04/05/22 0701 - 04/05/22 04/07/22  ?Closed System Drain 1 Lateral;Left LLQ Other (Comment) 12 Fr. 60 10 50 0   ? ? ?  ? ? ?Current examination: ?Flushes/aspirates easily.  ?Insertion site unremarkable. ?Suture and stat lock in place. ?Dressed  appropriately.  ? ?Plan: ?Continue TID flushes with 5 cc NS. ?Record output Q shift. ?Dressing changes QD or PRN if soiled.  ?Call IR APP or on call IR MD if difficulty flushing or sudden change in drain output.  ?Repeat imaging/possible drain injection once output < 10 mL/QD (excluding flush material.) ? ?Discharge planning: ?Please contact IR APP or on call IR MD prior to patient d/c to ensure appropriate follow up plans are in place. Typically patient will follow up with IR clinic 10-14 days post d/c for repeat imaging/possible drain injection. IR scheduler will contact patient with date/time of appointment. Patient will need to flush drain QD with 5 cc NS, record output QD, dressing changes every 2-3 days or earlier if soiled.  ? ?IR will continue to follow - please call with questions or concerns. ? ? ? ? ? ?Electronically Signed: ?04/07/22, PA-C ?04/05/2022, 8:40 AM ? ? ?I spent a total of 15 Minutes at the the patient's bedside AND on the patient's hospital floor or unit, greater than 50% of which was counseling/coordinating care for left lower abdominal abscess drain ? ? ? ?Patient ID: Jim Moran, male   DOB: 1961-03-07, 60 y.o.   MRN: Vicenta Dunning ? ?

## 2022-04-05 NOTE — Progress Notes (Signed)
?PROGRESS NOTE ? ? ? ?Jim Moran  WGY:[id number] DOB: Feb 11, 1961 DOA: 03/30/2022 ?PCP: Roderick Pee, PA  ?Chief Complaint  ?Patient presents with  ? Abdominal Pain  ? ? ?Brief Narrative:  ?61 year old male with history of hypertension presents with 6-day history of abdominal pain, pain was located in left lower quadrant.  Also complained of constipation.  CT scan abdomen pelvis showed sigmoid diverticulitis with 2.8 x 1.2 cm small abscess within the left lateral wall of the sigmoid colon.  General surgery was consulted for IV antibiotics.  Patient started on IV Zosyn.  ? ? ?Assessment & Plan: ?  ?Principal Problem: ?  Abscess of sigmoid colon due to diverticulitis ?Active Problems: ?  SIRS (systemic inflammatory response syndrome) (HCC) ?  History of adenomatous polyp of colon ?  AKI (acute kidney injury) (HCC) ?  Leukocytosis ?  Hypertension, essential ?  Diverticulitis of sigmoid colon with abscess ?  Diverticulitis ? ? ?Assessment and Plan: ?* Abscess of sigmoid colon due to diverticulitis ?CT 4/17 with diverticulitis of sigmoid colon, suspected early/small abscess collection within the left lateral wall of the sigmoid colon (2.8x1.2 cm), bladder wall thickening - likely reactive ?CT 4/21 with acute diverticulitis of the sigmoid colon, fluid collection of wall of sigmoid increasing in size.  Additional focal area of fluid and inflammatory change in the LLQ, concerning for developing abscess. ?4/21 IR placed CT guided 12 Fr drainage catheter into diverticular abscess  ?Follow cultures (gram positive cocci, rare gram neg rods) ?Transitioned to meropenem 4/21 ?Dysphagia 1 diet, per surgery ?Appreciate surgery assistance - suspects pt would benefit from segmental colonic resection at some point.  Suspect he will develop colocutaneous fistula at abscess cavity in mesentery.  Surgery hoping to manage with abx/drainage with eventual surgical procedure in 6-8 weeks. ?Pain regimen per general surgery, bowel  regimen ?Colonoscopy 6-8 weeks to r/o malignancy ? ? ? ? ? ?SIRS (systemic inflammatory response syndrome) (HCC) ?Fever overnight with leukocytosis, related to above with drain placement ?Follow on abx ?improving ?Consider blood cultures if recurrent ? ?Leukocytosis ?Related to above ? ?AKI (acute kidney injury) (HCC) ?improved ? ?Hypertension, essential ?Amlodipine, lisinopril ? ? ?DVT prophylaxis: lovenox ?Code Status: full ?Family Communication: none ?Disposition:  ? ?Status is: Inpatient ?Remains inpatient appropriate because: need for IV abx, surgical clearance ?  ?Consultants:  ?Surgery ?IR ? ?Procedures:  ?CT guided placement of Axil Copeman 12 Fr drainage catheter placement into the diverticular abscess 4/21 ? ?Antimicrobials:  ?Anti-infectives (From admission, onward)  ? ? Start     Dose/Rate Route Frequency Ordered Stop  ? 04/03/22 1600  ertapenem (INVANZ) 1,000 mg in sodium chloride 0.9 % 100 mL IVPB       ? 1 g ?200 mL/hr over 30 Minutes Intravenous Every 24 hours 04/03/22 1511    ? 03/30/22 2245  piperacillin-tazobactam (ZOSYN) IVPB 3.375 g  Status:  Discontinued       ?See Hyperspace for full Linked Orders Report.  ? 3.375 g ?12.5 mL/hr over 240 Minutes Intravenous Every 8 hours 03/30/22 1430 04/03/22 1511  ? 03/30/22 1445  piperacillin-tazobactam (ZOSYN) IVPB 3.375 g       ?See Hyperspace for full Linked Orders Report.  ? 3.375 g ?100 mL/hr over 30 Minutes Intravenous  Once 03/30/22 1430 03/30/22 1543  ? ?  ? ? ?Subjective: ?Pain improved today ?Says he's been up and walking all day ?Asks questions about his dys 1 diet ? ?Objective: ?Vitals:  ? 04/04/22 1421 04/04/22 2126 04/05/22 0618 04/05/22 7793  ?  BP: 111/68 115/82 132/76   ?Pulse: 65 70 80 83  ?Resp: 16 16 16    ?Temp: 97.9 ?F (36.6 ?C) (!) 97.5 ?F (36.4 ?C) 98.6 ?F (37 ?C)   ?TempSrc:  Oral Oral   ?SpO2: 95% 94% 90% 94%  ?Weight:      ?Height:      ? ? ?Intake/Output Summary (Last 24 hours) at 04/05/2022 0852 ?Last data filed at 04/05/2022 0500 ?Gross per 24  hour  ?Intake 2988.15 ml  ?Output 10 ml  ?Net 2978.15 ml  ? ?Filed Weights  ? 03/30/22 1110  ?Weight: 81.6 kg  ? ? ?Examination: ? ?General: No acute distress. ?Cardiovascular: RRR ?Lungs: unlabored ?Abdomen: tenderness, most to LLQ, mild to RLQ (improved from yesterday) ?Neurological: Alert and oriented ?3. Moves all extremities ?4. Cranial nerves II through XII grossly intact. ?Skin: Warm and dry. No rashes or lesions. ?Extremities: No clubbing or cyanosis. No edema.  ? ?Data Reviewed: I have personally reviewed following labs and imaging studies ? ?CBC: ?Recent Labs  ?Lab 04/01/22 ?0440 04/02/22 ?04/04/22 04/03/22 ?0409 04/04/22 ?0407 04/05/22 ?04/07/22  ?WBC 12.6* 10.9* 11.8* 18.8* 14.7*  ?NEUTROABS  --   --   --  16.4* 12.3*  ?HGB 12.5* 12.5* 12.4* 12.6* 12.3*  ?HCT 36.7* 37.9* 37.4* 38.9* 35.7*  ?MCV 96.1 97.4 97.1 96.3 96.2  ?PLT 285 341 351 417* 404*  ? ? ?Basic Metabolic Panel: ?Recent Labs  ?Lab 03/31/22 ?0422 04/01/22 ?0440 04/02/22 ?04/04/22 04/04/22 ?0407 04/05/22 ?04/07/22  ?NA 135 134* 136 131* 133*  ?K 4.1 3.8 4.4 4.5 4.3  ?CL 100 98 96* 95* 98  ?CO2 28 27 32 26 27  ?GLUCOSE 101* 114* 114* 123* 112*  ?BUN 14 8 8 14 17   ?CREATININE 0.96 0.93 1.10 1.25* 1.07  ?CALCIUM 8.8* 8.7* 9.1 8.8* 8.7*  ?MG  --   --   --  1.9 2.1  ?PHOS  --   --   --  4.8* 3.6  ? ? ?GFR: ?Estimated Creatinine Clearance: 72.8 mL/min (by C-G formula based on SCr of 1.07 mg/dL). ? ?Liver Function Tests: ?Recent Labs  ?Lab 03/30/22 ?1119 03/31/22 ?0422 04/01/22 ?0440 04/04/22 ?0407 04/05/22 ?0420  ?AST 17 16 17 30 23   ?ALT 16 15 16 28 23   ?ALKPHOS 73 62 63 64 62  ?BILITOT 0.5 0.3 0.6 0.6 0.5  ?PROT 8.3* 7.0 6.5 6.9 6.4*  ?ALBUMIN 4.6 3.5 3.2* 3.2* 2.9*  ? ? ?CBG: ?No results for input(s): GLUCAP in the last 168 hours. ? ? ?Recent Results (from the past 240 hour(s))  ?Urine Culture     Status: None  ? Collection Time: 03/30/22 11:19 AM  ? Specimen: In/Out Cath Urine  ?Result Value Ref Range Status  ? Specimen Description   Final  ?  IN/OUT CATH  URINE ?Performed at 04/07/22, [address], [place], 04/01/22 [facility] ?  ? Special Requests   Final  ?  NONE ?Performed at [address], [address], [place], [address] [facility] ?  ? Culture   Final  ?  NO GROWTH ?Performed at [facility], [address] [place], [address] [facility] ?  ? Report Status 03/31/2022 FINAL  Final  ?Resp Panel by RT-PCR (Flu Deeann Servidio&B, Covid) Nasopharyngeal Swab     Status: None  ? Collection Time: 03/30/22  2:49 PM  ? Specimen: Nasopharyngeal Swab; Nasopharyngeal(NP) swabs in vial transport medium  ?Result Value Ref Range Status  ? SARS Coronavirus 2 by RT PCR NEGATIVE  NEGATIVE Final  ?  Comment: (NOTE) ?SARS-CoV-2 target nucleic acids are NOT DETECTED. ? ?The SARS-CoV-2 RNA is generally detectable in upper respiratory ?specimens during the acute phase of infection. The lowest ?concentration of SARS-CoV-2 viral copies this assay can detect is ?138 copies/mL. Sherrita Riederer negative result does not preclude SARS-Cov-2 ?infection and should not be used as the sole basis for treatment or ?other patient management decisions. Dorette Hartel negative result may occur with  ?improper specimen collection/handling, submission of specimen other ?than nasopharyngeal swab, presence of viral mutation(s) within the ?areas targeted by this assay, and inadequate number of viral ?copies(<138 copies/mL). Lynsie Mcwatters negative result must be combined with ?clinical observations, patient history, and epidemiological ?information. The expected result is Negative. ? ?Fact Sheet for Patients:  ?BloggerCourse.comhttps://www.fda.gov/media/152166/download ? ?Fact Sheet for Healthcare Providers:  ?SeriousBroker.ithttps://www.fda.gov/media/152162/download ? ?This test is no t yet approved or cleared by the [place] FDA and  ?has been authorized for detection and/or diagnosis of SARS-CoV-2 by ?FDA under an Emergency Use Authorization (EUA). This EUA will remain  ?in effect (meaning this test can be used) for the duration of  the ?COVID-19 declaration under Section 564(b)(1) of the Act, 21 ?U.S.C.section 360bbb-3(b)(1), unless the authorization is terminated  ?or revoked sooner.  ? ? ?  ? Influenza Mysty Kielty by PCR NEGATIVE NEGATIVE Final  ? Influenza

## 2022-04-05 NOTE — Progress Notes (Signed)
? ?Jim Moran ?[id number] ?05/22/1961 ? ?CARE TEAM: ? ?PCP: Roderick Pee, PA ? ?Outpatient Care Team: Patient Care Team: ?Roderick Pee, PA as PCP - General (Family Medicine) ?Farris Has, MD as Referring Physician (Family Medicine) ?Vida Rigger, MD as Consulting Physician (Gastroenterology) ? ?Inpatient Treatment Team: Treatment Team: Attending Provider: Zigmund Daniel., MD; Consulting Physician: Montez Morita, Md, MD; Rounding Team: Alton Revere, MD; Rounding Team: [facility], MD; Registered Nurse: Gilmer Mor, RN; Registered Nurse: Jolene Provost, RN; Registered Nurse: Kristine Garbe, RN; Utilization Review: Clydia Llano, RN; Charge Nurse: Beverly Sessions, RN ? ? ?Problem List:  ? ?Principal Problem: ?  Abscess of sigmoid colon due to diverticulitis ?Active Problems: ?  Hypertension, essential ?  Diverticulitis of sigmoid colon with abscess ?  History of adenomatous polyp of colon ?  Diverticulitis ?  AKI (acute kidney injury) (HCC) ?  Leukocytosis ?  SIRS (systemic inflammatory response syndrome) (HCC) ? ? ?   * No surgery found * ? ? ? ? ? ?Assessment ? ?Sigmoid diverticulitis with phlegmon and progression of abscess despite appropriate antibiotics. ?(Hospital Stay = 6 days) ? ?Plan ?Diverticulitis with abscess and phlegmon ?-Persistent pain despite appropriate IV therapy.  CT scan showed more consistent abscess formation.   ?-Drain placed evening 4/21 with 60 mL of pus aspirated.   ?-WBC elevated but falling down after drain placement.  Follow and see if it will fall down further.   ?-Improve pain control.  Scheduled Flexeril ketorolac and acetaminophen.  As needed hydromorphone.  Hold oral narcotics with possible ileus now.  Challenging the patient with chronic pain issues in the past ?-Tolerating clears so advance to dysphagia 1/full liquid diet.  Hold on that until has bowel movements.   ?-mobilize ?-cont to follow  ? ?Strongly suspect patient would benefit  from segmental colonic resection at some point.  Pain less and leukocytosis improving gives hope that this current attack can be controlled and avoid Hartmann resection this hospitalization.  We will see.  Suspect he will develop a delayed colocutaneous fistula at the abscess cavity in the mesentery.  See if we can control this attack with drainage antibiotic adjustment and limpness along until we can do a 1 stage procedure with immediate anastomosis.  Robotic resection.  Most likely would happen in June, 6-8 weeks from this drain placement ?  ?FEN - NPO, sips w/ meds ?VTE - Lovenox ?ID - zosyn -4/21.  Ertapenem 4/21-.  Follow-up on drain cultures to rule out atypical organisms.  No obvious yeast.  Numerous organisms so far. ?  ? ?Disposition:  ?Disposition:  ?The patient is from: Home ? ?Anticipate discharge to:  Home ? ?Anticipated Date of Discharge is:  April 26,2023 ?  ? ?Barriers to discharge:  Pending Clinical improvement (more likely than not) ? ?Patient currently is NOT MEDICALLY STABLE for discharge from the hospital from a surgery standpoint. ? ? ? ? ? ?I reviewed nursing notes, hospitalist notes, last 24 h vitals and pain scores, last 48 h intake and output, last 24 h labs and trends, and last 24 h imaging results. I have reviewed this patient's available data, including medical history, events of note, test results, etc as part of my evaluation.  A significant portion of that time was spent in counseling.  Care during the described time interval was provided by me. ? ?This care required moderate level of medical decision making.  04/05/2022 ? ? ? ?Subjective: ?(Chief complaint) ? ?Patient still  with soreness but better today.  Did not realize he could get more than just Tylenol.  Better educated now. ? ?Started to pass gas again. ? ?Tolerated liquids. ? ?Objective: ? ?Vital signs: ? ?Vitals:  ? 04/04/22 1421 04/04/22 2126 04/05/22 0618 04/05/22 [id number]  ?BP: 111/68 115/82 132/76   ?Pulse: 65 70 80 83  ?Resp:  16 16 16    ?Temp: 97.9 ?F (36.6 ?C) (!) 97.5 ?F (36.4 ?C) 98.6 ?F (37 ?C)   ?TempSrc:  Oral Oral   ?SpO2: 95% 94% 90% 94%  ?Weight:      ?Height:      ? ? ?Last BM Date : 03/27/22 ? ?Intake/Output  ? ?Yesterday: ? 04/22 0701 - 04/23 0700 ?In: 2988.2 [P.O.:1620; I.V.:1113.2; IV Piggyback:250] ?Out: 50 [Drains:50] ?This shift: ? No intake/output data recorded. ? ?Bowel function: ? Flatus: No ? BM:  No ? Drain:  Dark reddish-brown.  Most likely consistent with draining hematoma at abscess cavity ? ? ?Physical Exam: ? ?General: Pt awake/alert in no acute distress.  Not toxic nor sickly but mildly uncomfortable. ?Eyes: PERRL, normal EOM.  Sclera clear.  No icterus ?Neuro: CN II-XII intact w/o focal sensory/motor deficits. ?Lymph: No head/neck/groin lymphadenopathy ?Psych:  No delerium/psychosis/paranoia.  Oriented x 4 ?HENT: Normocephalic, Mucus membranes moist.  No thrush ?Neck: Supple, No tracheal deviation.  No obvious thyromegaly ?Chest: No pain to chest wall compression.  Good respiratory excursion.  No audible wheezing ?CV:  Pulses intact.  Regular rhythm.  No major extremity edema ?MS: Normal AROM mjr joints.  No obvious deformity ? ?Abdomen: Soft.  Mildy distended.  Tenderness at LLQ>>RLQ - esp drain site .  Mild focal peritonitis.  Slightly improved.  No incarcerated hernias. ? ?Ext:   No deformity.  No mjr edema.  No cyanosis ?Skin: No petechiae / purpurea.  No major sores.  Warm and dry ? ? ? ?Results:  ? ?Cultures: ?Recent Results (from the past 720 hour(s))  ?Urine Culture     Status: None  ? Collection Time: 03/30/22 11:19 AM  ? Specimen: In/Out Cath Urine  ?Result Value Ref Range Status  ? Specimen Description   Final  ?  IN/OUT CATH URINE ?Performed at [facility], [address], [place], [place] [address] ?  ? Special Requests   Final  ?  NONE ?Performed at [facility], [address], [place], [place] [address] ?  ? Culture   Final  ?  NO GROWTH ?Performed at  [facility], [address] [place], [place] [address] ?  ? Report Status 03/31/2022 FINAL  Final  ?Resp Panel by RT-PCR (Flu A&B, Covid) Nasopharyngeal Swab     Status: None  ? Collection Time: 03/30/22  2:49 PM  ? Specimen: Nasopharyngeal Swab; Nasopharyngeal(NP) swabs in vial transport medium  ?Result Value Ref Range Status  ? SARS Coronavirus 2 by RT PCR NEGATIVE NEGATIVE Final  ?  Comment: (NOTE) ?SARS-CoV-2 target nucleic acids are NOT DETECTED. ? ?The SARS-CoV-2 RNA is generally detectable in upper respiratory ?specimens during the acute phase of infection. The lowest ?concentration of SARS-CoV-2 viral copies this assay can detect is ?138 copies/mL. A negative result does not preclude SARS-Cov-2 ?infection and should not be used as the sole basis for treatment or ?other patient management decisions. A negative result may occur with  ?improper specimen collection/handling, submission of specimen other ?than nasopharyngeal swab, presence of viral mutation(s) within the ?areas targeted by this assay, and inadequate number of viral ?copies(<138 copies/mL).  A negative result must be combined with ?clinical observations, patient history, and epidemiological ?information. The expected result is Negative. ? ?Fact Sheet for Patients:  ?BloggerCourse.com ? ?Fact Sheet for Healthcare Providers:  ?SeriousBroker.it ? ?This test is no t yet approved or cleared by the [place] FDA and  ?has been authorized for detection and/or diagnosis of SARS-CoV-2 by ?FDA under an Emergency Use Authorization (EUA). This EUA will remain  ?in effect (meaning this test can be used) for the duration of the ?COVID-19 declaration under Section 564(b)(1) of the Act, 21 ?U.S.C.section 360bbb-3(b)(1), unless the authorization is terminated  ?or revoked sooner.  ? ? ?  ? Influenza A by PCR NEGATIVE NEGATIVE Final  ? Influenza B by PCR NEGATIVE NEGATIVE Final  ?  Comment: (NOTE) ?The  Xpert Xpress SARS-CoV-2/FLU/RSV plus assay is intended as an aid ?in the diagnosis of influenza from Nasopharyngeal swab specimens and ?should not be used as a sole basis for treatment. Nasal washings and ?

## 2022-04-06 DIAGNOSIS — K5720 Diverticulitis of large intestine with perforation and abscess without bleeding: Secondary | ICD-10-CM | POA: Diagnosis not present

## 2022-04-06 LAB — BASIC METABOLIC PANEL
Anion gap: 6 (ref 5–15)
BUN: 16 mg/dL (ref 6–20)
CO2: 28 mmol/L (ref 22–32)
Calcium: 8.4 mg/dL — ABNORMAL LOW (ref 8.9–10.3)
Chloride: 101 mmol/L (ref 98–111)
Creatinine, Ser: 0.94 mg/dL (ref 0.61–1.24)
GFR, Estimated: >60 mL/min (ref >60)
Glucose, Bld: 116 mg/dL — ABNORMAL HIGH (ref 70–99)
Potassium: 4.9 mmol/L (ref 3.5–5.1)
Sodium: 135 mmol/L (ref 135–145)

## 2022-04-06 LAB — CBC
HCT: 35.5 % — ABNORMAL LOW (ref 39.0–52.0)
Hemoglobin: 11.8 g/dL — ABNORMAL LOW (ref 13.0–17.0)
MCH: 32.3 pg (ref 26.0–34.0)
MCHC: 33.2 g/dL (ref 30.0–36.0)
MCV: 97.3 fL (ref 80.0–100.0)
Platelets: 472 10*3/uL — ABNORMAL HIGH (ref 150–400)
RBC: 3.65 MIL/uL — ABNORMAL LOW (ref 4.22–5.81)
RDW: 12.4 % (ref 11.5–15.5)
WBC: 18 10*3/uL — ABNORMAL HIGH (ref 4.0–10.5)
nRBC: 0 % (ref 0.0–0.2)

## 2022-04-06 MED ORDER — AMOXICILLIN-POT CLAVULANATE 875-125 MG PO TABS
1.0000 | ORAL_TABLET | Freq: Two times a day (BID) | ORAL | Status: DC
  Administered 2022-04-06 – 2022-04-07 (×3): 1 via ORAL
  Filled 2022-04-06 (×3): qty 1

## 2022-04-06 NOTE — Progress Notes (Addendum)
? ?Progress Note ? ?   ?Subjective: ?Pt reports he started having some bowel function yesterday. BMs have been mostly loose or diarrheal but no blood or mucus. Pain still present in LLQ and radiating across lower abdomen but overall improving. He reports pain is worst with coughing or getting out of bed. He has been ambulating and getting up to chair. Denies n/v. Afebrile.  ? ?Objective: ?Vital signs in last 24 hours: ?Temp:  [97.7 ?F (36.5 ?C)-98.9 ?F (37.2 ?C)] 97.8 ?F (36.6 ?C) (04/24 1443) ?Pulse Rate:  [70-87] 70 (04/24 0605) ?Resp:  [14-20] 16 (04/24 1540) ?BP: (101-135)/(67-87) 135/87 (04/24 0867) ?SpO2:  [95 %-97 %] 95 % (04/24 0605) ?Last BM Date :  (patient still unsure, passing gas, not distented, said it was sometime last wee) ? ?Intake/Output from previous day: ?04/23 0701 - 04/24 0700 ?In: 1298.6 [P.O.:480; I.V.:655.6; IV Piggyback:150] ?Out: 15 [Drains:15] ?Intake/Output this shift: ?No intake/output data recorded. ? ?PE: ?General: pleasant, WD, WN male who is laying in bed in NAD ?Heart: regular, rate, and rhythm.  ?Lungs: CTAB, no wheezes, rhonchi, or rales noted.  Respiratory effort nonlabored ?Abd: soft, mild ttp across lower abdomen without peritonitis, mild distention, +BS, drain in LLQ with thin bloody drainage ?MS: all 4 extremities are symmetrical with no cyanosis, clubbing, or edema. ?Skin: warm and dry with no masses, lesions, or rashes ?Psych: A&Ox3 with an appropriate affect.  ? ? ?Lab Results:  ?Recent Labs  ?  04/05/22 ?0420 04/06/22 ?0356  ?WBC 14.7* 18.0*  ?HGB 12.3* 11.8*  ?HCT 35.7* 35.5*  ?PLT 404* 472*  ? ?BMET ?Recent Labs  ?  04/05/22 ?0420 04/06/22 ?0356  ?NA 133* 135  ?K 4.3 4.9  ?CL 98 101  ?CO2 27 28  ?GLUCOSE 112* 116*  ?BUN 17 16  ?CREATININE 1.07 0.94  ?CALCIUM 8.7* 8.4*  ? ?PT/INR ?No results for input(s): LABPROT, INR in the last 72 hours. ?CMP  ?   ?Component Value Date/Time  ? NA 135 04/06/2022 0356  ? K 4.9 04/06/2022 0356  ? CL 101 04/06/2022 0356  ? CO2 28  04/06/2022 0356  ? GLUCOSE 116 (H) 04/06/2022 0356  ? BUN 16 04/06/2022 0356  ? CREATININE 0.94 04/06/2022 0356  ? CALCIUM 8.4 (L) 04/06/2022 0356  ? PROT 6.4 (L) 04/05/2022 0420  ? ALBUMIN 2.9 (L) 04/05/2022 0420  ? AST 23 04/05/2022 0420  ? ALT 23 04/05/2022 0420  ? ALKPHOS 62 04/05/2022 0420  ? BILITOT 0.5 04/05/2022 0420  ? GFRNONAA >60 04/06/2022 0356  ? GFRAA 53 (L) 06/15/2019 0149  ? ?Lipase  ?   ?Component Value Date/Time  ? LIPASE <10 (L) 03/30/2022 1119  ? ? ? ? ? ?Studies/Results: ?No results found. ? ?Anti-infectives: ?Anti-infectives (From admission, onward)  ? ? Start     Dose/Rate Route Frequency Ordered Stop  ? 04/03/22 1600  ertapenem (INVANZ) 1,000 mg in sodium chloride 0.9 % 100 mL IVPB       ? 1 g ?200 mL/hr over 30 Minutes Intravenous Every 24 hours 04/03/22 1511    ? 03/30/22 2245  piperacillin-tazobactam (ZOSYN) IVPB 3.375 g  Status:  Discontinued       ?See Hyperspace for full Linked Orders Report.  ? 3.375 g ?12.5 mL/hr over 240 Minutes Intravenous Every 8 hours 03/30/22 1430 04/03/22 1511  ? 03/30/22 1445  piperacillin-tazobactam (ZOSYN) IVPB 3.375 g       ?See Hyperspace for full Linked Orders Report.  ? 3.375 g ?100 mL/hr over 30 Minutes  Intravenous  Once 03/30/22 1430 03/30/22 1543  ? ?  ? ? ? ?Assessment/Plan ?Sigmoid diverticulitis with abscess and phlegmon ?- S/P IR drain placement 4/21 ?- Cx with strep constellatus and reincubated for possible anaerobe ?- WBC up to 18 today from 14 but clinically seems improving ?- VSS ?- continue to mobilize as tolerated, continue IV abx ?- advance to low fiber diet ? ?FEN: low fiber diet, SLIV ?VTE: LMWH ?ID: Zosyn 4/17>4/21; 4/21>> ? LOS: 7 days  ? ?I independently reviewed and agree with radiologist assessment of CT AP from 4/21. Reviewed CBC and BMET. Reviewed intake and output and vitals for last 24 hrs. Reviewed hospitalist and IR notes. Reviewed meds for last 24 hrs. Discussed with hospitalist.  ? ? ?Juliet Rude, PA-C ?[facility] ?04/06/2022, 8:39 AM ?Please see Amion for pager number during day hours 7:00am-4:30pm ? ?

## 2022-04-06 NOTE — Progress Notes (Signed)
? ? ?Referring Physician(s): Dr. Johney Maine ? ?Supervising Physician: Jacqulynn Cadet ? ?Patient Status:  [facility] - In-pt ? ?Chief Complaint: Sigmoid diverticulitis with abscess s/p drain placement in IR 04/03/22  ? ?Subjective: Patient in bed watching TV. He has been started on a soft diet and is so far tolerating that. He states his abdominal pain is less.  ? ?Allergies: ?Morphine ? ?Medications: ?Prior to Admission medications   ?Medication Sig Start Date End Date Taking? Authorizing Provider  ?amLODipine (NORVASC) 5 MG tablet Take 5 mg by mouth daily. 02/27/22  Yes [provider]  ?aspirin 81 MG tablet Take 81 mg by mouth 2 (two) times a week.   Yes [provider]  ?cyclobenzaprine (FLEXERIL) 10 MG tablet Take 10 mg by mouth 3 (three) times daily as needed for muscle spasms.   Yes [provider]  ?lisinopril (ZESTRIL) 20 MG tablet Take 20 mg by mouth daily. 02/27/22  Yes [provider]  ?TYLENOL PM EXTRA STRENGTH 500-25 MG TABS tablet Take 1 tablet by mouth at bedtime as needed.   Yes [provider]  ?HYDROcodone-acetaminophen (NORCO/VICODIN) 5-325 MG per tablet 1-2 tablets every 4-6 hours prn pain ?Patient not taking: Reported on 03/30/2022 09/14/12   Eulas Post, MD  ?predniSONE (DELTASONE) 10 MG tablet 6-6-4-4-3-3-2-1 ?Patient not taking: Reported on 03/30/2022 09/14/12   Eulas Post, MD  ? ? ? ?Vital Signs: ?BP 135/87 (BP Location: Left Arm)   Pulse 70   Temp 97.8 ?F (36.6 ?C) (Oral)   Resp 16   Ht 5\' 8"  (1.727 m)   Wt 179 lb 14.3 oz (81.6 kg)   SpO2 95%   BMI 27.35 kg/m?  ? ?Physical Exam ?Constitutional:   ?   General: He is not in acute distress. ?   Appearance: He is not ill-appearing.  ?Pulmonary:  ?   Effort: Pulmonary effort is normal.  ?Abdominal:  ?   Palpations: Abdomen is soft.  ?   Comments: LLQ drain to gravity. Approximately 25 ml of thin sanguineous fluid in bag. Drain easily flushed and aspirated. Dressing is clean/dry/intact.    ?Skin: ?   General: Skin is warm and dry.  ?Neurological:  ?   Mental Status: He is alert and oriented to person, place, and time.  ? ? ?Imaging: ?CT ABDOMEN PELVIS W CONTRAST ? ?Result Date: 04/03/2022 ?CLINICAL DATA:  Follow-up diverticulitis EXAM: CT ABDOMEN AND PELVIS WITH CONTRAST TECHNIQUE: Multidetector CT imaging of the abdomen and pelvis was performed using the standard protocol following bolus administration of intravenous contrast. RADIATION DOSE REDUCTION: This exam was performed according to the departmental dose-optimization program which includes automated exposure control, adjustment of the mA and/or kV according to patient size and/or use of iterative reconstruction technique. CONTRAST:  140mL OMNIPAQUE IOHEXOL 300 MG/ML  SOLN COMPARISON:  CT abdomen and pelvis dated March 30, 2022 FINDINGS: Lower chest: No acute abnormality.  Coronary artery calcifications. Hepatobiliary: No focal liver abnormality is seen. No gallstones, gallbladder wall thickening, or biliary dilatation. Pancreas: Unremarkable. No pancreatic ductal dilatation or surrounding inflammatory changes. Spleen: Splenic calcifications, unchanged compared to prior exam. Normal size. Adrenals/Urinary Tract: Adrenal glands are unremarkable. Kidneys are normal, without renal calculi, focal lesion, or hydronephrosis. Wall thickening of the bladder dome, likely reactive. Stomach/Bowel: Marked wall thickening of the sigmoid colon and surrounding inflammatory change. Fluid collection of the wall the sigmoid colon is increased in size measuring 2.7 x 1.9 cm, previously 2.8 x 0.2 cm. Fluid inflammatory change surrounding the sigmoid colon  with a focal area in the left lower quadrant which appears more organized when compared with prior exam measuring 5.4 x 3.7 cm. Normal appendix. No evidence of obstruction. Vascular/Lymphatic: Aortic atherosclerosis. No enlarged abdominal or pelvic lymph nodes. Reproductive: Prostate is unremarkable. Other:  Moderate fat containing right inguinal hernia. No intra-abdominal free air. Musculoskeletal: No acute or significant osseous findings. IMPRESSION: 1. Acute diverticulitis of the sigmoid colon.Fluid collection of the wall the sigmoid colon is increased in size measuring 2.7 x 1.9 cm, previously 2.8 x 1.2 cm. Additional focal area of fluid and inflammatory change is seen in the left lower quadrant which appears more organized when compared with prior exam measuring 5.4 x 3.7 cm, concerning for developing abscess. 2. Wall thickening of the bladder dome, likely reactive. 3.  Aortic Atherosclerosis (ICD10-I70.0). Electronically Signed   By: Yetta Glassman M.D.   On: 04/03/2022 15:20   ? ?Labs: ? ?CBC: ?Recent Labs  ?  04/03/22 ?0409 04/04/22 ?0407 04/05/22 ?0420 04/06/22 ?0356  ?WBC 11.8* 18.8* 14.7* 18.0*  ?HGB 12.4* 12.6* 12.3* 11.8*  ?HCT 37.4* 38.9* 35.7* 35.5*  ?PLT 351 417* 404* 472*  ? ? ?COAGS: ?Recent Labs  ?  03/30/22 ?1116  ?INR 1.0  ?APTT 35  ? ? ?BMP: ?Recent Labs  ?  04/02/22 ?0412 04/04/22 ?0407 04/05/22 ?0420 04/06/22 ?0356  ?NA 136 131* 133* 135  ?K 4.4 4.5 4.3 4.9  ?CL 96* 95* 98 101  ?CO2 32 26 27 28   ?GLUCOSE 114* 123* 112* 116*  ?BUN 8 14 17 16   ?CALCIUM 9.1 8.8* 8.7* 8.4*  ?CREATININE 1.10 1.25* 1.07 0.94  ?GFRNONAA >60 >60 >60 >60  ? ? ?LIVER FUNCTION TESTS: ?Recent Labs  ?  03/31/22 ?0422 04/01/22 ?[id number] 04/04/22 ?0407 04/05/22 ?0420  ?BILITOT 0.3 0.6 0.6 0.5  ?AST 16 17 30 23   ?ALT 15 16 28 23   ?ALKPHOS 62 63 64 62  ?PROT 7.0 6.5 6.9 6.4*  ?ALBUMIN 3.5 3.2* 3.2* 2.9*  ? ? ?Assessment and Plan: ? ?Sigmoid diverticulitis with abscess s/p drain placement in IR 04/03/22 ? ?Worsening leukocytosis but clinically improving. Preliminary culture positive for strep constellatus. Afebrile, tolerating diet. Patient unsure of discharge date.  ? ?Drain Location: LLQ ?Size: Fr size: 12 Fr ?Date of placement: 04/03/22 ?Currently to: Drain collection device: gravity ?24 hour output:  ?Output by Drain (mL) 04/04/22  0701 - 04/04/22 1900 04/04/22 1901 - 04/05/22 0700 04/05/22 0701 - 04/05/22 1900 04/05/22 1901 - 04/06/22 0700 04/06/22 0701 - 04/06/22 1122  ?Closed System Drain 1 Lateral;Left LLQ Other (Comment) 12 Fr. 50 0  15   ? ? ?Interval imaging/drain manipulation:  ?None ? ?Current examination: ?Flushes/aspirates easily.  ?Insertion site unremarkable. ?Dressed appropriately.  ? ?Plan: ?Continue TID flushes with 5 cc NS. ?Record output Q shift. ?Dressing changes QD or PRN if soiled.  ?Call IR APP or on call IR MD if difficulty flushing or sudden change in drain output.  ?Repeat imaging/possible drain injection once output < 10 mL/QD (excluding flush material.) ? ?Discharge planning: ?An order has been placed for the patient to follow up with IR in our outpatient clinic in 10-14 days. Patient states his wife has a history of drain placement and they are both familiar/comfortable with drain care including drain flushes. Patient instructed to document the daily output, flush the drain once daily with 5-10 ml NS and to keep the site clean and dry. He knows a scheduler from our clinic will contact him after discharge with a date/time of  his appointment and that he can call the clinic with any questions prior to his visit.  ? ?IR will continue to follow - please call with questions or concerns. ? ?Electronically Signed: ?Soyla Dryer, AGACNP-BC ?[phone] ?04/06/2022, 11:21 AM ? ? ?I spent a total of 15 Minutes at the the patient's bedside AND on the patient's hospital floor or unit, greater than 50% of which was counseling/coordinating care for diverticular abscess drain.  ? ? ? ? ? ?

## 2022-04-06 NOTE — Progress Notes (Signed)
?PROGRESS NOTE ? ? ? ?Jim Moran  GMW:[id number] DOB: Mar 08, 1961 DOA: 03/30/2022 ?PCP: Roderick Pee, PA  ?Chief Complaint  ?Patient presents with  ? Abdominal Pain  ? ? ?Brief Narrative:  ?61 year old male with history of hypertension presents with 6-day history of abdominal pain, pain was located in left lower quadrant.  Also complained of constipation.  CT scan abdomen pelvis showed sigmoid diverticulitis with 2.8 x 1.2 cm small abscess within the left lateral wall of the sigmoid colon.  General surgery was consulted for IV antibiotics.  Patient started on IV Zosyn.  ? ? ?Assessment & Plan: ?  ?Principal Problem: ?  Abscess of sigmoid colon due to diverticulitis ?Active Problems: ?  SIRS (systemic inflammatory response syndrome) (HCC) ?  History of adenomatous polyp of colon ?  AKI (acute kidney injury) (HCC) ?  Leukocytosis ?  Hypertension, essential ?  Diverticulitis of sigmoid colon with abscess ?  Diverticulitis ? ? ?Assessment and Plan: ?* Abscess of sigmoid colon due to diverticulitis ?CT 4/17 with diverticulitis of sigmoid colon, suspected early/small abscess collection within the left lateral wall of the sigmoid colon (2.8x1.2 cm), bladder wall thickening - likely reactive ?CT 4/21 with acute diverticulitis of the sigmoid colon, fluid collection of wall of sigmoid increasing in size.  Additional focal area of fluid and inflammatory change in the LLQ, concerning for developing abscess. ?4/21 IR placed CT guided 12 Fr drainage catheter into diverticular abscess  ?Follow cultures (pansensitive e. Coli, strep constellatus, bacteroides fragilis, bacteroides thetaiotaomicron - follow sensitivities) ?Transitioned to meropenem 4/21 -4/24.  Narrow to augmentin today. ?Low fiber diet, per surgery ?Appreciate surgery assistance - suspects pt would benefit from segmental colonic resection at some point.  Suspect he will develop colocutaneous fistula at abscess cavity in mesentery.  Surgery hoping to manage with  abx/drainage with eventual surgical procedure in 6-8 weeks. ?Pain regimen per general surgery, bowel regimen ?Colonoscopy 6-8 weeks to r/o malignancy ?Needs to follow up outpatient in IR outpatient clinic in 10-14 days, flush drain once daily 5-10 ml NS, document daily output ? ? ? ? ? ?SIRS (systemic inflammatory response syndrome) (HCC) ?Afebrile today ?Follow on abx ?improving ?Consider blood cultures if recurrent ? ?Leukocytosis ?Mildly worse, follow on abx ? ?AKI (acute kidney injury) (HCC) ?improved ? ?Hypertension, essential ?Amlodipine, lisinopril ? ? ?DVT prophylaxis: lovenox ?Code Status: full ?Family Communication: none ?Disposition:  ? ?Status is: Inpatient ?Remains inpatient appropriate because: need for IV abx, surgical clearance ?  ?Consultants:  ?Surgery ?IR ? ?Procedures:  ?CT guided placement of Meiko Stranahan 12 Fr drainage catheter placement into the diverticular abscess 4/21 ? ?Antimicrobials:  ?Anti-infectives (From admission, onward)  ? ? Start     Dose/Rate Route Frequency Ordered Stop  ? 04/06/22 1515  amoxicillin-clavulanate (AUGMENTIN) 875-125 MG per tablet 1 tablet       ? 1 tablet Oral Every 12 hours 04/06/22 1417    ? 04/03/22 1600  ertapenem (INVANZ) 1,000 mg in sodium chloride 0.9 % 100 mL IVPB  Status:  Discontinued       ? 1 g ?200 mL/hr over 30 Minutes Intravenous Every 24 hours 04/03/22 1511 04/06/22 1417  ? 03/30/22 2245  piperacillin-tazobactam (ZOSYN) IVPB 3.375 g  Status:  Discontinued       ?See Hyperspace for full Linked Orders Report.  ? 3.375 g ?12.5 mL/hr over 240 Minutes Intravenous Every 8 hours 03/30/22 1430 04/03/22 1511  ? 03/30/22 1445  piperacillin-tazobactam (ZOSYN) IVPB 3.375 g       ?See  Hyperspace for full Linked Orders Report.  ? 3.375 g ?100 mL/hr over 30 Minutes Intravenous  Once 03/30/22 1430 03/30/22 1543  ? ?  ? ? ?Subjective: ?Feels better ? ?Objective: ?Vitals:  ? 04/05/22 1220 04/05/22 2111 04/06/22 0605 04/06/22 1403  ?BP: 124/72 101/67 135/87 117/71  ?Pulse: 87  70 70 80  ?Resp: 20 14 16 16   ?Temp: 98.9 ?F (37.2 ?C) 97.7 ?F (36.5 ?C) 97.8 ?F (36.6 ?C) 98.8 ?F (37.1 ?C)  ?TempSrc: Oral Oral Oral Oral  ?SpO2: 96% 97% 95% 94%  ?Weight:      ?Height:      ? ? ?Intake/Output Summary (Last 24 hours) at 04/06/2022 1443 ?Last data filed at 04/06/2022 0600 ?Gross per 24 hour  ?Intake 230 ml  ?Output 15 ml  ?Net 215 ml  ? ?Filed Weights  ? 03/30/22 1110  ?Weight: 81.6 kg  ? ? ?Examination: ? ?General: No acute distress. ?Cardiovascular: RRR ?Lungs: unlabored ?Abdomen: TTP in LLQ, drain with bloody drainage ?Neurological: Alert and oriented ?3. Moves all extremities ?4. Cranial nerves II through XII grossly intact. ?Skin: Warm and dry. No rashes or lesions. ?Extremities: No clubbing or cyanosis. No edema.  ? ?Data Reviewed: I have personally reviewed following labs and imaging studies ? ?CBC: ?Recent Labs  ?Lab 04/02/22 ?04/04/22 04/03/22 ?0409 04/04/22 ?0407 04/05/22 ?04/07/22 04/06/22 ?04/08/22  ?WBC 10.9* 11.8* 18.8* 14.7* 18.0*  ?NEUTROABS  --   --  16.4* 12.3*  --   ?HGB 12.5* 12.4* 12.6* 12.3* 11.8*  ?HCT 37.9* 37.4* 38.9* 35.7* 35.5*  ?MCV 97.4 97.1 96.3 96.2 97.3  ?PLT 341 351 417* 404* 472*  ? ? ?Basic Metabolic Panel: ?Recent Labs  ?Lab 04/01/22 ?0440 04/02/22 ?04/04/22 04/04/22 ?0407 04/05/22 ?04/07/22 04/06/22 ?04/08/22  ?NA 134* 136 131* 133* 135  ?K 3.8 4.4 4.5 4.3 4.9  ?CL 98 96* 95* 98 101  ?CO2 27 32 26 27 28   ?GLUCOSE 114* 114* 123* 112* 116*  ?BUN 8 8 14 17 16   ?CREATININE 0.93 1.10 1.25* 1.07 0.94  ?CALCIUM 8.7* 9.1 8.8* 8.7* 8.4*  ?MG  --   --  1.9 2.1  --   ?PHOS  --   --  4.8* 3.6  --   ? ? ?GFR: ?Estimated Creatinine Clearance: 82.9 mL/min (by C-G formula based on SCr of 0.94 mg/dL). ? ?Liver Function Tests: ?Recent Labs  ?Lab 03/31/22 ?0422 04/01/22 ?0440 04/04/22 ?0407 04/05/22 ?0420  ?AST 16 17 30 23   ?ALT 15 16 28 23   ?ALKPHOS 62 63 64 62  ?BILITOT 0.3 0.6 0.6 0.5  ?PROT 7.0 6.5 6.9 6.4*  ?ALBUMIN 3.5 3.2* 3.2* 2.9*  ? ? ?CBG: ?No results for input(s): GLUCAP in the last 168  hours. ? ? ?Recent Results (from the past 240 hour(s))  ?Urine Culture     Status: None  ? Collection Time: 03/30/22 11:19 AM  ? Specimen: In/Out Cath Urine  ?Result Value Ref Range Status  ? Specimen Description   Final  ?  IN/OUT CATH URINE ?Performed at 04/06/22, [address], [place], 04/01/22 ?  ? Special Requests   Final  ?  NONE ?Performed at [facility], [address] [place], [place] [address] ?  ? Culture   Final  ?  NO GROWTH ?Performed at [facility], [address] [place], [place] [address] ?  ? Report Status 03/31/2022 FINAL  Final  ?Resp Panel by RT-PCR (Flu Elic Vencill&B, Covid) Nasopharyngeal Swab  Status: None  ? Collection Time: 03/30/22  2:49 PM  ? Specimen: Nasopharyngeal Swab; Nasopharyngeal(NP) swabs in vial transport medium  ?Result Value Ref Range Status  ? SARS Coronavirus 2 by RT PCR NEGATIVE NEGATIVE Final  ?  Comment: (NOTE) ?SARS-CoV-2 target nucleic acids are NOT DETECTED. ? ?The SARS-CoV-2 RNA is generally detectable in upper respiratory ?specimens during the acute phase of infection. The lowest ?concentration of SARS-CoV-2 viral copies this assay can detect is ?138 copies/mL. Lester Crickenberger negative result does not preclude SARS-Cov-2 ?infection and should not be used as the sole basis for treatment or ?other patient management decisions. Shekelia Boutin negative result may occur with  ?improper specimen collection/handling, submission of specimen other ?than nasopharyngeal swab, presence of viral mutation(s) within the ?areas targeted by this assay, and inadequate number of viral ?copies(<138 copies/mL). Kapil Petropoulos negative result must be combined with ?clinical observations, patient history, and epidemiological ?information. The expected result is Negative. ? ?Fact Sheet for Patients:  ?BloggerCourse.comhttps://www.fda.gov/media/152166/download ? ?Fact Sheet for Healthcare Providers:  ?SeriousBroker.ithttps://www.fda.gov/media/152162/download ? ?This test is no t yet approved or cleared by  the [place] FDA and  ?has been authorized for detection and/or diagnosis of SARS-CoV-2 by ?FDA under an Emergency Use Authorization (EUA). This EUA will remain  ?in effect (meaning this test can be used)

## 2022-04-07 LAB — CBC
HCT: 33.9 % — ABNORMAL LOW (ref 39.0–52.0)
Hemoglobin: 11.2 g/dL — ABNORMAL LOW (ref 13.0–17.0)
MCH: 31.9 pg (ref 26.0–34.0)
MCHC: 33 g/dL (ref 30.0–36.0)
MCV: 96.6 fL (ref 80.0–100.0)
Platelets: 563 10*3/uL — ABNORMAL HIGH (ref 150–400)
RBC: 3.51 MIL/uL — ABNORMAL LOW (ref 4.22–5.81)
RDW: 12.8 % (ref 11.5–15.5)
WBC: 13.7 10*3/uL — ABNORMAL HIGH (ref 4.0–10.5)
nRBC: 0 % (ref 0.0–0.2)

## 2022-04-07 LAB — AEROBIC/ANAEROBIC CULTURE W GRAM STAIN (SURGICAL/DEEP WOUND)

## 2022-04-07 MED ORDER — OXYCODONE HCL 5 MG PO TABS: 5.0000 mg | ORAL_TABLET | Freq: Four times a day (QID) | ORAL | 0 refills | Status: AC | PRN

## 2022-04-07 MED ORDER — SODIUM CHLORIDE 0.9% FLUSH: 5.0000 mL | Freq: Every day | INTRAVENOUS | 1 refills | Status: AC

## 2022-04-07 MED ORDER — AMOXICILLIN-POT CLAVULANATE 875-125 MG PO TABS: 1.0000 | ORAL_TABLET | Freq: Two times a day (BID) | ORAL | 0 refills | Status: AC

## 2022-04-07 MED ORDER — POLYETHYLENE GLYCOL 3350 17 G PO PACK
17.0000 g | PACK | Freq: Every day | ORAL | 0 refills | Status: AC | PRN
Start: 2022-04-07 — End: ?

## 2022-04-07 NOTE — Progress Notes (Signed)
? ?Progress Note ? ?   ?Subjective: ?Pt tolerating diet and having bowel function. Would like to go over drain teaching with his wife and RN today. Still has some soreness but improving overall. Discussed plans for follow up with IR and then general surgery and need for colonoscopy in 6-8 weeks and patient understands.   ? ?Objective: ?Vital signs in last 24 hours: ?Temp:  [97.5 ?F (36.4 ?C)-98.8 ?F (37.1 ?C)] 97.5 ?F (36.4 ?C) (04/25 7616) ?Pulse Rate:  [67-80] 67 (04/25 0737) ?Resp:  [16] 16 (04/25 1062) ?BP: (109-121)/(71-103) 121/103 (04/25 6948) ?SpO2:  [94 %] 94 % (04/25 5462) ?Last BM Date : 04/06/22 ? ?Intake/Output from previous day: ?04/24 0701 - 04/25 0700 ?In: 0  ?Out: 30 [Drains:30] ?Intake/Output this shift: ?Total I/O ?In: 240 [P.O.:240] ?Out: 0  ? ?PE: ?General: pleasant, WD, WN male who is laying in bed in NAD ?Heart: regular, rate, and rhythm.  ?Lungs: CTAB, no wheezes, rhonchi, or rales noted.  Respiratory effort nonlabored ?Abd: soft, mild ttp across lower abdomen without peritonitis, mild distention, +BS, drain in LLQ with thin bloody drainage ?MS: all 4 extremities are symmetrical with no cyanosis, clubbing, or edema. ?Skin: warm and dry with no masses, lesions, or rashes ?Psych: A&Ox3 with an appropriate affect.  ? ? ?Lab Results:  ?Recent Labs  ?  04/06/22 ?0356 04/07/22 ?7035  ?WBC 18.0* 13.7*  ?HGB 11.8* 11.2*  ?HCT 35.5* 33.9*  ?PLT 472* 563*  ? ? ?BMET ?Recent Labs  ?  04/05/22 ?0420 04/06/22 ?0356  ?NA 133* 135  ?K 4.3 4.9  ?CL 98 101  ?CO2 27 28  ?GLUCOSE 112* 116*  ?BUN 17 16  ?CREATININE 1.07 0.94  ?CALCIUM 8.7* 8.4*  ? ? ?PT/INR ?No results for input(s): LABPROT, INR in the last 72 hours. ?CMP  ?   ?Component Value Date/Time  ? NA 135 04/06/2022 0356  ? K 4.9 04/06/2022 0356  ? CL 101 04/06/2022 0356  ? CO2 28 04/06/2022 0356  ? GLUCOSE 116 (H) 04/06/2022 0356  ? BUN 16 04/06/2022 0356  ? CREATININE 0.94 04/06/2022 0356  ? CALCIUM 8.4 (L) 04/06/2022 0356  ? PROT 6.4 (L) 04/05/2022  0420  ? ALBUMIN 2.9 (L) 04/05/2022 0420  ? AST 23 04/05/2022 0420  ? ALT 23 04/05/2022 0420  ? ALKPHOS 62 04/05/2022 0420  ? BILITOT 0.5 04/05/2022 0420  ? GFRNONAA >60 04/06/2022 0356  ? GFRAA 53 (L) 06/15/2019 0149  ? ?Lipase  ?   ?Component Value Date/Time  ? LIPASE <10 (L) 03/30/2022 1119  ? ? ? ? ? ?Studies/Results: ?No results found. ? ?Anti-infectives: ?Anti-infectives (From admission, onward)  ? ? Start     Dose/Rate Route Frequency Ordered Stop  ? 04/07/22 0000  amoxicillin-clavulanate (AUGMENTIN) 875-125 MG tablet       ? 1 tablet Oral Every 12 hours 04/07/22 1020 04/21/22 2359  ? 04/06/22 1515  amoxicillin-clavulanate (AUGMENTIN) 875-125 MG per tablet 1 tablet       ? 1 tablet Oral Every 12 hours 04/06/22 1417    ? 04/03/22 1600  ertapenem (INVANZ) 1,000 mg in sodium chloride 0.9 % 100 mL IVPB  Status:  Discontinued       ? 1 g ?200 mL/hr over 30 Minutes Intravenous Every 24 hours 04/03/22 1511 04/06/22 1417  ? 03/30/22 2245  piperacillin-tazobactam (ZOSYN) IVPB 3.375 g  Status:  Discontinued       ?See Hyperspace for full Linked Orders Report.  ? 3.375 g ?12.5 mL/hr over 240  Minutes Intravenous Every 8 hours 03/30/22 1430 04/03/22 1511  ? 03/30/22 1445  piperacillin-tazobactam (ZOSYN) IVPB 3.375 g       ?See Hyperspace for full Linked Orders Report.  ? 3.375 g ?100 mL/hr over 30 Minutes Intravenous  Once 03/30/22 1430 03/30/22 1543  ? ?  ? ? ? ?Assessment/Plan ?Sigmoid diverticulitis with abscess and phlegmon ?- S/P IR drain placement 4/21 ?- Cx with pan sensitive E.coli - switched to PO augmentin yesterday  ?- WBC down to 13 ?- VSS ?- recommend 14 days PO abx on discharge ?- F/U with IR and general surgery in chart. He is aware he needs to make an appointment with GI to get scheduled for colonoscopy ?- stable for discharge home with drain after drain teaching with RN from a surgical perspective ? ?FEN: low fiber diet, SLIV ?VTE: LMWH ?ID: Zosyn 4/17>4/21; Invanz 4/21>4/24; PO augmentin 4/24>> ? LOS: 8  days  ? ?Reviewed CBC. Reviewed intake and output and vitals for last 24 hrs. Reviewed hospitalist and IR notes. Reviewed meds for last 24 hrs. Discussed with hospitalist.  ? ? ?Juliet Rude, PA-C ?[facility] ?04/07/2022, 12:07 PM ?Please see Amion for pager number during day hours 7:00am-4:30pm ? ?

## 2022-04-07 NOTE — Discharge Summary (Signed)
Physician Discharge Summary  ?Jim Moran See [name]:[id number] DOB: 02/26/1961 DOA: 03/30/2022 ? ?PCP: Jim Moran, Jim Jyll Tomaro, PA ? ?Admit date: 03/30/2022 ?Discharge date: 04/07/2022 ? ?Time spent: 40 minutes ? ?Recommendations for Outpatient Follow-up:  ?Follow outpatient CBC/CMP  ?Follow with general surgery as an outpatient ?Follow with interventional radiology outpatient ?Needs to eventually coordinate outpatient colonoscopy per GI/surgery recs ? ?Discharge Diagnoses:  ?Principal Problem: ?  Abscess of sigmoid colon due to diverticulitis ?Active Problems: ?  SIRS (systemic inflammatory response syndrome) (HCC) ?  History of adenomatous polyp of colon ?  AKI (acute kidney injury) (HCC) ?  Leukocytosis ?  Hypertension, essential ?  Diverticulitis of sigmoid colon with abscess ?  Diverticulitis ? ? ?Discharge Condition: stable ? ?Diet recommendation: heart thealthy ? ?Filed Weights  ? 03/30/22 1110  ?Weight: 81.6 kg  ? ? ?History of present illness:  ?61 year old male with history of hypertension presents with 6-day history of abdominal pain, pain was located in left lower quadrant.  Also complained of constipation.  CT scan abdomen pelvis showed sigmoid diverticulitis with 2.8 x 1.2 cm small abscess within the left lateral wall of the sigmoid colon.  General surgery was consulted for IV antibiotics.  Patient started on IV Zosyn.  Repeat imaging on 4/21 showed increasing size of fluid collection of wall of sigmoid colon and additional areal of fluid and inflammatory change in LLQ, more organized when compared to prior exam concerning for abscess.  He's now s/p CT guided placement of 12 french all purpose drain catheter.  He's improving with plan for outpatient follow up with IR, general surgery, and gastroenterology. ? ?See below for additional details ? ?Hospital Course:  ?Assessment and Plan: ?* Abscess of sigmoid colon due to diverticulitis ?CT 4/17 with diverticulitis of sigmoid colon, suspected early/small abscess collection  within the left lateral wall of the sigmoid colon (2.8x1.2 cm), bladder wall thickening - likely reactive ?CT 4/21 with acute diverticulitis of the sigmoid colon, fluid collection of wall of sigmoid increasing in size.  Additional focal area of fluid and inflammatory change in the LLQ, concerning for developing abscess. ?4/21 IR placed CT guided 12 Fr drainage catheter into diverticular abscess  ?Follow cultures (pansensitive e. Coli, strep constellatus, bacteroides fragilis, bacteroides thetaiotaomicron - follow sensitivities) ?Transitioned to meropenem 4/21 -4/24.  Narrow to augmentin today.  Will discharge with augmentin x 14 days.   ?Low fiber diet, per surgery ?Appreciate surgery assistance - suspects pt would benefit from segmental colonic resection at some point.  Suspect he will develop colocutaneous fistula at abscess cavity in mesentery.  Surgery hoping to manage with abx/drainage with eventual surgical procedure in 6-8 weeks. ?Pain regimen per general surgery, bowel regimen ?Colonoscopy 6-8 weeks to r/o malignancy ?Needs to follow up outpatient in IR outpatient clinic in 10-14 days, flush drain once daily 5-10 ml NS, document daily output ? ? ? ? ? ?SIRS (systemic inflammatory response syndrome) (HCC) ?Afebrile today ?Follow on abx ?improving ? ?Leukocytosis ?Improved, follow outpatient ? ?AKI (acute kidney injury) (HCC) ?improved ? ?Hypertension, essential ?Amlodipine, lisinopril ? ? ?Procedures: ?CT guided placement of 12 french all purpose drain catheter ? ?Consultations: ?General surgery ?IR ? ?Discharge Exam: ?Vitals:  ? 04/06/22 2256 04/07/22 [id number]  ?BP: 109/74 (!) 121/103  ?Pulse: 75 67  ?Resp: 16 16  ?Temp: 97.7 ?F (36.5 ?C) (!) 97.5 ?F (36.4 ?C)  ?SpO2: 94% 94%  ? ?Feeling better, still some pain ?Wants some pain meds to go home with ? ?General: No acute distress. ?Cardiovascular: RRR ?Lungs:  unlabored ?Abdomen: Soft, improved TTP to LLQ - drain with bloody fluid ?Neurological: Alert and oriented  ?3. Moves all extremities ?4 . Cranial nerves II through XII grossly intact. ?Skin: Warm and dry. No rashes or lesions. ?Extremities: No clubbing or cyanosis. No edema.  ? ?Discharge Instructions ? ? ?Discharge Instructions   ? ? Call MD for:  difficulty breathing, headache or visual disturbances   Complete by: As directed ?  ? Call MD for:  extreme fatigue   Complete by: As directed ?  ? Call MD for:  hives   Complete by: As directed ?  ? Call MD for:  persistant dizziness or light-headedness   Complete by: As directed ?  ? Call MD for:  persistant nausea and vomiting   Complete by: As directed ?  ? Call MD for:  redness, tenderness, or signs of infection (pain, swelling, redness, odor or green/yellow discharge around incision site)   Complete by: As directed ?  ? Call MD for:  severe uncontrolled pain   Complete by: As directed ?  ? Call MD for:  temperature >100.4   Complete by: As directed ?  ? Diet - low sodium heart healthy   Complete by: As directed ?  ? Discharge instructions   Complete by: As directed ?  ? You were seen for diverticulitis with an abscess.  This required drainage by interventional radiology. ? ?You've gradually improved with antibiotics and drainage.  You'll discharge home with Fedra Lanter plan to continue antibiotics and the drain and follow with surgery outpatient to discuss next steps. ? ?Interventional radiology has planned for follow up in 10-14 days.  Document the drain output daily, flush the drain once [name] day with 5-10 ml NS, keep the site clean and dry. ? ?Gastroenterology wants to follow up with you as well for [name] colonoscopy as an outpatient.  Coordinate this with general surgery as well.   ? ?We'll send you home with [name] few days of oxycodone for pain, try to minimize the use of opiates as possible.  If you need Novis League refill, follow with your PCP or general surgery.  Use miralax as needed to avoid constipation. ? ?Return for new, recurrent, or worsening symptoms. ? ?Please ask your PCP to request  records from this hospitalization so they know what was done and what the next steps will be.  ? Discharge wound care:   Complete by: As directed ?  ? Per IR  ? Increase activity slowly   Complete by: As directed ?  ? ?  ? ?Allergies as of 04/07/2022   ? ?   Reactions  ? Morphine Other (See Comments)  ? Hallucinations  ? ?  ? ?  ?Medication List  ?  ? ?STOP taking these medications   ? ?HYDROcodone-acetaminophen 5-325 MG tablet ?Commonly known as: NORCO/VICODIN ?  ?predniSONE 10 MG tablet ?Commonly known as: DELTASONE ?  ? ?  ? ?TAKE these medications   ? ?amLODipine 5 MG tablet ?Commonly known as: NORVASC ?Take 5 mg by mouth daily. ?  ?amoxicillin-clavulanate 875-125 MG tablet ?Commonly known as: AUGMENTIN ?Take 1 tablet by mouth every 12 (twelve) hours for 14 days. ?  ?aspirin 81 MG tablet ?Take 81 mg by mouth 2 (two) times Eran Mistry week. ?  ?cyclobenzaprine 10 MG tablet ?Commonly known as: FLEXERIL ?Take 10 mg by mouth 3 (three) times daily as needed for muscle spasms. ?  ?lisinopril 20 MG tablet ?Commonly known as: ZESTRIL ?Take 20 mg by mouth daily. ?  ?  oxyCODONE 5 MG immediate release tablet ?Commonly known as: Roxicodone ?Take 1 tablet (5 mg total) by mouth every 6 (six) hours as needed for up to 5 days. (Try to use ibuprofen or tylenol first, minimize opiate use as able) ?  ?polyethylene glycol 17 g packet ?Commonly known as: MiraLax ?Take 17 g by mouth daily as needed. ?  ?sodium chloride flush 0.9 % Soln ?Commonly known as: NS ?5 mLs by Intracatheter route daily. ?  ?Tylenol PM Extra Strength 25-500 MG Tabs tablet ?Generic drug: diphenhydramine-acetaminophen ?Take 1 tablet by mouth at bedtime as needed. ?  ? ?  ? ?  ?  ? ? ?  ?Discharge Care Instructions  ?(From admission, onward)  ?  ? ? ?  ? ?  Start     Ordered  ? 04/07/22 0000  Discharge wound care:       ?Comments: Per IR  ? 04/07/22 1020  ? ?  ?  ? ?  ? ?Allergies  ?Allergen Reactions  ? Morphine Other (See Comments)  ?  Hallucinations  ? ? Follow-up  Information   ? ? [facility] Follow up.   ?Why: Please follow up with [facility] 10-14 days after hospital discharge. Jayveon Convey scheduler from our office will call you with Amaya Blakeman date

## 2022-04-08 ENCOUNTER — Other Ambulatory Visit: Payer: Self-pay | Admitting: Surgery

## 2022-04-08 DIAGNOSIS — K651 Peritoneal abscess: Secondary | ICD-10-CM | POA: Diagnosis present

## 2022-04-08 DIAGNOSIS — Z79899 Other long term (current) drug therapy: Secondary | ICD-10-CM | POA: Diagnosis not present

## 2022-04-08 DIAGNOSIS — K5720 Diverticulitis of large intestine with perforation and abscess without bleeding: Secondary | ICD-10-CM | POA: Diagnosis not present

## 2022-04-08 DIAGNOSIS — K567 Ileus, unspecified: Secondary | ICD-10-CM | POA: Diagnosis present

## 2022-04-08 DIAGNOSIS — K4090 Unilateral inguinal hernia, without obstruction or gangrene, not specified as recurrent: Secondary | ICD-10-CM | POA: Diagnosis present

## 2022-04-08 DIAGNOSIS — N179 Acute kidney failure, unspecified: Secondary | ICD-10-CM | POA: Diagnosis present

## 2022-04-08 DIAGNOSIS — I10 Essential (primary) hypertension: Secondary | ICD-10-CM | POA: Diagnosis present

## 2022-04-08 DIAGNOSIS — Z7982 Long term (current) use of aspirin: Secondary | ICD-10-CM | POA: Diagnosis not present

## 2022-04-08 DIAGNOSIS — R6510 Systemic inflammatory response syndrome (SIRS) of non-infectious origin without acute organ dysfunction: Secondary | ICD-10-CM | POA: Diagnosis present

## 2022-04-08 DIAGNOSIS — Z885 Allergy status to narcotic agent status: Secondary | ICD-10-CM | POA: Diagnosis not present

## 2022-04-08 DIAGNOSIS — Z86010 Personal history of colonic polyps: Secondary | ICD-10-CM | POA: Diagnosis not present

## 2022-04-08 DIAGNOSIS — Z87891 Personal history of nicotine dependence: Secondary | ICD-10-CM | POA: Diagnosis not present

## 2022-04-08 DIAGNOSIS — K5900 Constipation, unspecified: Secondary | ICD-10-CM | POA: Diagnosis present

## 2022-04-08 DIAGNOSIS — K5792 Diverticulitis of intestine, part unspecified, without perforation or abscess without bleeding: Secondary | ICD-10-CM | POA: Diagnosis present

## 2022-04-08 DIAGNOSIS — G8929 Other chronic pain: Secondary | ICD-10-CM | POA: Diagnosis present

## 2022-04-08 DIAGNOSIS — Z20822 Contact with and (suspected) exposure to covid-19: Secondary | ICD-10-CM | POA: Diagnosis present

## 2022-04-10 ENCOUNTER — Inpatient Hospital Stay (HOSPITAL_COMMUNITY)
Admission: EM | Admit: 2022-04-10 | Discharge: 2022-04-12 | DRG: 394 | Discharge status: Against Medical Advice | Payer: BC Managed Care – PPO | Source: Ambulatory Visit | Attending: Surgery | Admitting: Surgery

## 2022-04-10 ENCOUNTER — Other Ambulatory Visit: Payer: Self-pay

## 2022-04-10 ENCOUNTER — Emergency Department (HOSPITAL_COMMUNITY): Payer: BC Managed Care – PPO

## 2022-04-10 ENCOUNTER — Encounter (HOSPITAL_COMMUNITY): Payer: Self-pay | Admitting: Emergency Medicine

## 2022-04-10 DIAGNOSIS — Z87891 Personal history of nicotine dependence: Secondary | ICD-10-CM

## 2022-04-10 DIAGNOSIS — Z833 Family history of diabetes mellitus: Secondary | ICD-10-CM

## 2022-04-10 DIAGNOSIS — I10 Essential (primary) hypertension: Secondary | ICD-10-CM | POA: Diagnosis present

## 2022-04-10 DIAGNOSIS — K4030 Unilateral inguinal hernia, with obstruction, without gangrene, not specified as recurrent: Secondary | ICD-10-CM | POA: Diagnosis present

## 2022-04-10 DIAGNOSIS — Z79899 Other long term (current) drug therapy: Secondary | ICD-10-CM

## 2022-04-10 DIAGNOSIS — Z5329 Procedure and treatment not carried out because of patient's decision for other reasons: Secondary | ICD-10-CM | POA: Diagnosis not present

## 2022-04-10 DIAGNOSIS — Z5309 Procedure and treatment not carried out because of other contraindication: Secondary | ICD-10-CM | POA: Diagnosis not present

## 2022-04-10 DIAGNOSIS — Z806 Family history of leukemia: Secondary | ICD-10-CM | POA: Diagnosis not present

## 2022-04-10 DIAGNOSIS — R011 Cardiac murmur, unspecified: Secondary | ICD-10-CM | POA: Diagnosis present

## 2022-04-10 DIAGNOSIS — L02214 Cutaneous abscess of groin: Secondary | ICD-10-CM | POA: Diagnosis present

## 2022-04-10 DIAGNOSIS — G8929 Other chronic pain: Secondary | ICD-10-CM | POA: Diagnosis present

## 2022-04-10 DIAGNOSIS — K5720 Diverticulitis of large intestine with perforation and abscess without bleeding: Secondary | ICD-10-CM | POA: Diagnosis present

## 2022-04-10 DIAGNOSIS — Z885 Allergy status to narcotic agent status: Secondary | ICD-10-CM | POA: Diagnosis not present

## 2022-04-10 DIAGNOSIS — R6510 Systemic inflammatory response syndrome (SIRS) of non-infectious origin without acute organ dysfunction: Secondary | ICD-10-CM | POA: Diagnosis present

## 2022-04-10 DIAGNOSIS — Z86010 Personal history of colonic polyps: Secondary | ICD-10-CM | POA: Diagnosis not present

## 2022-04-10 DIAGNOSIS — K4090 Unilateral inguinal hernia, without obstruction or gangrene, not specified as recurrent: Secondary | ICD-10-CM | POA: Diagnosis present

## 2022-04-10 DIAGNOSIS — N179 Acute kidney failure, unspecified: Secondary | ICD-10-CM | POA: Diagnosis present

## 2022-04-10 DIAGNOSIS — Z7982 Long term (current) use of aspirin: Secondary | ICD-10-CM

## 2022-04-10 DIAGNOSIS — Z9689 Presence of other specified functional implants: Secondary | ICD-10-CM | POA: Diagnosis present

## 2022-04-10 DIAGNOSIS — K651 Peritoneal abscess: Secondary | ICD-10-CM | POA: Diagnosis present

## 2022-04-10 DIAGNOSIS — K5792 Diverticulitis of intestine, part unspecified, without perforation or abscess without bleeding: Secondary | ICD-10-CM | POA: Diagnosis present

## 2022-04-10 DIAGNOSIS — K5900 Constipation, unspecified: Secondary | ICD-10-CM | POA: Diagnosis present

## 2022-04-10 DIAGNOSIS — K567 Ileus, unspecified: Secondary | ICD-10-CM | POA: Diagnosis present

## 2022-04-10 DIAGNOSIS — Z20822 Contact with and (suspected) exposure to covid-19: Secondary | ICD-10-CM | POA: Diagnosis present

## 2022-04-10 LAB — URINALYSIS, ROUTINE W REFLEX MICROSCOPIC
Bilirubin Urine: NEGATIVE
Glucose, UA: NEGATIVE mg/dL
Hgb urine dipstick: NEGATIVE
Ketones, ur: NEGATIVE mg/dL
Leukocytes,Ua: NEGATIVE
Nitrite: NEGATIVE
Protein, ur: NEGATIVE mg/dL
Specific Gravity, Urine: 1.015 (ref 1.005–1.030)
pH: 7 (ref 5.0–8.0)

## 2022-04-10 LAB — COMPREHENSIVE METABOLIC PANEL
ALT: 30 U/L (ref 0–44)
AST: 27 U/L (ref 15–41)
Albumin: 3.6 g/dL (ref 3.5–5.0)
Alkaline Phosphatase: 82 U/L (ref 38–126)
Anion gap: 8 (ref 5–15)
BUN: 17 mg/dL (ref 6–20)
CO2: 25 mmol/L (ref 22–32)
Calcium: 9.4 mg/dL (ref 8.9–10.3)
Chloride: 101 mmol/L (ref 98–111)
Creatinine, Ser: 1.16 mg/dL (ref 0.61–1.24)
GFR, Estimated: >60 mL/min (ref >60)
Glucose, Bld: 100 mg/dL — ABNORMAL HIGH (ref 70–99)
Potassium: 4.6 mmol/L (ref 3.5–5.1)
Sodium: 134 mmol/L — ABNORMAL LOW (ref 135–145)
Total Bilirubin: 0.4 mg/dL (ref 0.3–1.2)
Total Protein: 8.3 g/dL — ABNORMAL HIGH (ref 6.5–8.1)

## 2022-04-10 LAB — CBC
HCT: 41 % (ref 39.0–52.0)
Hemoglobin: 13.2 g/dL (ref 13.0–17.0)
MCH: 31.1 pg (ref 26.0–34.0)
MCHC: 32.2 g/dL (ref 30.0–36.0)
MCV: 96.7 fL (ref 80.0–100.0)
Platelets: 735 10*3/uL — ABNORMAL HIGH (ref 150–400)
RBC: 4.24 MIL/uL (ref 4.22–5.81)
RDW: 12.9 % (ref 11.5–15.5)
WBC: 12.4 10*3/uL — ABNORMAL HIGH (ref 4.0–10.5)
nRBC: 0 % (ref 0.0–0.2)

## 2022-04-10 LAB — LIPASE, BLOOD: Lipase: 32 U/L (ref 11–51)

## 2022-04-10 MED ORDER — LORAZEPAM 2 MG/ML IJ SOLN
INTRAMUSCULAR | Status: AC
  Filled 2022-04-10: qty 1

## 2022-04-10 MED ORDER — AMLODIPINE BESYLATE 5 MG PO TABS
5.0000 mg | ORAL_TABLET | Freq: Every day | ORAL | Status: DC
  Administered 2022-04-11: 5 mg via ORAL
  Filled 2022-04-10: qty 1

## 2022-04-10 MED ORDER — HYDROMORPHONE HCL 1 MG/ML IJ SOLN
0.5000 mg | Freq: Once | INTRAMUSCULAR | Status: AC
  Administered 2022-04-10: 0.5 mg via INTRAVENOUS
  Filled 2022-04-10: qty 1

## 2022-04-10 MED ORDER — ONDANSETRON 4 MG PO TBDP: 4.0000 mg | ORAL_TABLET | Freq: Four times a day (QID) | ORAL | Status: DC | PRN

## 2022-04-10 MED ORDER — ACETAMINOPHEN 325 MG PO TABS
650.0000 mg | ORAL_TABLET | Freq: Four times a day (QID) | ORAL | Status: DC | PRN
Start: 2022-04-10 — End: 2022-04-12

## 2022-04-10 MED ORDER — POTASSIUM CHLORIDE IN NACL 20-0.9 MEQ/L-% IV SOLN
INTRAVENOUS | Status: DC
  Filled 2022-04-10 (×5): qty 1000

## 2022-04-10 MED ORDER — SODIUM CHLORIDE 0.9 % IV BOLUS
1000.0000 mL | Freq: Once | INTRAVENOUS | Status: AC
  Administered 2022-04-10: 1000 mL via INTRAVENOUS

## 2022-04-10 MED ORDER — IOHEXOL 300 MG/ML  SOLN
100.0000 mL | Freq: Once | INTRAMUSCULAR | Status: AC | PRN
  Administered 2022-04-10: 100 mL via INTRAVENOUS

## 2022-04-10 MED ORDER — ACETAMINOPHEN 650 MG RE SUPP: 650.0000 mg | Freq: Four times a day (QID) | RECTAL | Status: DC | PRN

## 2022-04-10 MED ORDER — ONDANSETRON HCL 4 MG/2ML IJ SOLN: 4.0000 mg | Freq: Four times a day (QID) | INTRAMUSCULAR | Status: DC | PRN

## 2022-04-10 MED ORDER — PIPERACILLIN-TAZOBACTAM 3.375 G IVPB
3.3750 g | Freq: Three times a day (TID) | INTRAVENOUS | Status: DC
  Administered 2022-04-10 – 2022-04-12 (×5): 3.375 g via INTRAVENOUS
  Filled 2022-04-10 (×5): qty 50

## 2022-04-10 MED ORDER — FENTANYL CITRATE PF 50 MCG/ML IJ SOSY
50.0000 ug | PREFILLED_SYRINGE | INTRAMUSCULAR | Status: DC | PRN
  Administered 2022-04-11: 50 ug via INTRAVENOUS
  Filled 2022-04-10: qty 1

## 2022-04-10 MED ORDER — OXYCODONE HCL 5 MG PO TABS
5.0000 mg | ORAL_TABLET | ORAL | Status: DC | PRN
  Administered 2022-04-10 – 2022-04-11 (×3): 10 mg via ORAL
  Filled 2022-04-10 (×3): qty 2

## 2022-04-10 MED ORDER — LISINOPRIL 20 MG PO TABS
20.0000 mg | ORAL_TABLET | Freq: Every day | ORAL | Status: DC
  Administered 2022-04-11: 20 mg via ORAL
  Filled 2022-04-10: qty 1

## 2022-04-10 MED ORDER — LORAZEPAM 2 MG/ML IJ SOLN
1.0000 mg | Freq: Once | INTRAMUSCULAR | Status: AC
  Administered 2022-04-10: 1 mg via INTRAVENOUS

## 2022-04-10 MED ORDER — HYDROMORPHONE HCL 1 MG/ML IJ SOLN
1.0000 mg | Freq: Once | INTRAMUSCULAR | Status: AC
  Administered 2022-04-10: 1 mg via INTRAVENOUS
  Filled 2022-04-10: qty 1

## 2022-04-10 MED ORDER — ENOXAPARIN SODIUM 40 MG/0.4ML IJ SOSY
40.0000 mg | PREFILLED_SYRINGE | INTRAMUSCULAR | Status: DC
  Administered 2022-04-11 (×2): 40 mg via SUBCUTANEOUS
  Filled 2022-04-10 (×2): qty 0.4

## 2022-04-10 NOTE — Progress Notes (Signed)
Patient ID: Jim Moran, male   DOB: Mar 22, 1961, 61 y.o.   MRN: [id number] ? ?Reviewed CT scan - appears to be a small abscess in the right inguinal hernia sac - probably infection from the diverticular abscess.  No bowel involvement.  The original diverticular abscess is improved, but there is a new fluid collection (3.2 cm) lateral to the sigmoid colon.  These appear too small to drain.  ? ?Admit for IV antibiotics.  Clear liquids ? ?Discussed with patient and his wife. ? ?Imogene Burn. Tomy Khim, MD, FACS ?[facility]  ?General Surgery ? ? ?04/10/2022 ?7:07 PM ? ?

## 2022-04-10 NOTE — ED Provider Notes (Signed)
Care assumed from previous provider PA Arthor Captain. Please see note for further details.  In short, patient is a 61 year old male who presented due to an nonreducible right-sided inguinal hernia.  Was sent by PCP.  ED staff has been unable to reduce the hernia and surgery was consulted. ? ?Surgical plan pending at shift change.  Will follow their recommendations. ? ?Physical Exam  ?BP 126/82   Pulse 73   Temp 98.2 ?F (36.8 ?C) (Oral)   Resp 18   SpO2 97%  ? ?Physical Exam ?Vitals and nursing note reviewed.  ?Constitutional:   ?   Appearance: Normal appearance.  ?HENT:  ?   Head: Normocephalic and atraumatic.  ?Eyes:  ?   General: No scleral icterus. ?   Conjunctiva/sclera: Conjunctivae normal.  ?Pulmonary:  ?   Effort: Pulmonary effort is normal. No respiratory distress.  ?Skin: ?   Findings: No rash.  ?Neurological:  ?   Mental Status: He is alert.  ?Psychiatric:     ?   Mood and Affect: Mood normal.  ? ? ?Procedures  ?Procedures ? ?ED Course / MDM  ?  ?Medical Decision Making ?Amount and/or Complexity of Data Reviewed ?Labs: ordered. ? ?Risk ?Prescription drug management. ?Decision regarding hospitalization. ? ? ?Leary Roca, PA-C says that the plan is to assess the hernia with a CT scan.  CT scan results listed below: ? ?IMPRESSION:  ?There is moderate to large sized right inguinal hernia. There is  ?interval appearance of stranding in the fat planes within the right  ?inguinal hernia. There is interval appearance of 2.8 cm loculated  ?thick-walled fluid collection in the right inguinal hernial sac,  ?possibly an abscess. Possibility of extension of infectious process  ?from sigmoid diverticulitis into the right inguinal hernial sac is  ?not excluded.  ?   ?There is interval clearing of pericolic abscess posterior to the  ?sigmoid colon after placement of percutaneous drainage catheter.  ?There is a new 3.2 cm pericolic abscess with thick wall lateral to  ?the inferior descending colon at the level left  iliac crest.  ?   ?There is no evidence of intestinal obstruction or pneumoperitoneum.  ?There is no hydronephrosis.  ? ?On-call surgeon, Tsuei was consulted about these results and will admit the patient. ?  ?Saddie Benders, PA-C ?04/10/22 1901 ? ?  ?Rolan Bucco, MD ?04/18/22 1310 ? ?

## 2022-04-10 NOTE — ED Triage Notes (Signed)
Pt c/o hernial pain on the right groin. He was sent by PCP who unsuccessfully attempted to push hernia back in. PCP instructed pt to come to ED. Patient was recently discharged from [facility] after being admitted for an abscess on his colon which was drained.  ?

## 2022-04-10 NOTE — ED Provider Notes (Signed)
?[facility]-EMERGENCY DEPT ?Provider Note ? ? ?CSN: [id number] ?Arrival date & time: 04/10/22  1233 ? ?  ? ?History ? ?Chief Complaint  ?Patient presents with  ? Groin Pain  ? ? ?Jim Moran is a 61 y.o. male with a past medical history of hypertension who was discharged just 3 days ago from the hospital after being admitted with diverticulitis with abscess.  He had associated SIRS, was treated with IV Zosyn and had a percutaneous drain placed by IR.  Patient was doing well but has been on oxycodone every 6 hours for pain relief.  Last night he noticed a very painful bulge in his right groin.  He states it is very severe whenever he tries to move or change position and when he is needed to get up and urinate he has to apply direct pressure in order to be able to move.  He was seen this morning in follow-up by his primary care physician who noted that he appeared to have a very large inguinal hernia, attempted to reduce it and was unable.  He called Dr. Michaell Cowing of [facility] who asked that he come into the ER for hernia reduction attempt.  Patient states that his pain is about 7 out of 10 at rest and 10 out of 10 when he tries to move.  His wife states that he has had some increase in the size since this morning. ? ? ?Groin Pain ? ? ?  ? ?Home Medications ?Prior to Admission medications   ?Medication Sig Start Date End Date Taking? Authorizing Provider  ?amLODipine (NORVASC) 5 MG tablet Take 5 mg by mouth daily. 02/27/22   [provider]  ?amoxicillin-clavulanate (AUGMENTIN) 875-125 MG tablet Take 1 tablet by mouth every 12 (twelve) hours for 14 days. 04/07/22 04/21/22  Zigmund Daniel., MD  ?aspirin 81 MG tablet Take 81 mg by mouth 2 (two) times a week.    [provider]  ?cyclobenzaprine (FLEXERIL) 10 MG tablet Take 10 mg by mouth 3 (three) times daily as needed for muscle spasms.    [provider]  ?lisinopril (ZESTRIL) 20 MG tablet Take 20 mg by mouth  daily. 02/27/22   [provider]  ?oxyCODONE (ROXICODONE) 5 MG immediate release tablet Take 1 tablet (5 mg total) by mouth every 6 (six) hours as needed for up to 5 days. (Try to use ibuprofen or tylenol first, minimize opiate use as able) 04/07/22 04/12/22  Zigmund Daniel., MD  ?polyethylene glycol (MIRALAX) 17 g packet Take 17 g by mouth daily as needed. 04/07/22   Zigmund Daniel., MD  ?sodium chloride flush (NS) 0.9 % SOLN 5 mLs by Intracatheter route daily. 04/07/22   Juliet Rude, PA-C  ?TYLENOL PM EXTRA STRENGTH 500-25 MG TABS tablet Take 1 tablet by mouth at bedtime as needed.    [provider]  ?   ? ?Allergies    ?Morphine   ? ?Review of Systems   ?Review of Systems ? ?Physical Exam ?Updated Vital Signs ?BP 117/77 (BP Location: Left Arm)   Pulse (!) 101   Temp 98.2 ?F (36.8 ?C) (Oral)   Resp 18   SpO2 99%  ?Physical Exam ?Vitals and nursing note reviewed.  ?Constitutional:   ?   General: He is not in acute distress. ?   Appearance: He is well-developed. He is not diaphoretic.  ?HENT:  ?   Head: Normocephalic and atraumatic.  ?Eyes:  ?   General:  No scleral icterus. ?   Conjunctiva/sclera: Conjunctivae normal.  ?Cardiovascular:  ?   Rate and Rhythm: Normal rate and regular rhythm.  ?   Heart sounds: Normal heart sounds.  ?Pulmonary:  ?   Effort: Pulmonary effort is normal. No respiratory distress.  ?   Breath sounds: Normal breath sounds.  ?Abdominal:  ?   Palpations: Abdomen is soft.  ?   Tenderness: There is no abdominal tenderness.  ?Genitourinary: ?   Comments: Tender, bulge in the right groin without discoloration. ?Musculoskeletal:  ?   Cervical back: Normal range of motion and neck supple.  ?Skin: ?   General: Skin is warm and dry.  ?Neurological:  ?   Mental Status: He is alert.  ?Psychiatric:     ?   Behavior: Behavior normal.  ? ? ?ED Results / Procedures / Treatments   ?Labs ?(all labs ordered are listed, but only abnormal results are displayed) ?Labs Reviewed   ?LIPASE, BLOOD  ?COMPREHENSIVE METABOLIC PANEL  ?CBC  ?URINALYSIS, ROUTINE W REFLEX MICROSCOPIC  ? ? ?EKG ?None ? ?Radiology ?No results found. ? ?Procedures ?Hernia reduction ? ?Date/Time: 04/10/2022 3:41 PM ?Performed by: Arthor Captain, PA-C ?Authorized by: Arthor Captain, PA-C  ?Consent: Verbal consent obtained. ?Consent given by: patient ?Patient understanding: patient states understanding of the procedure being performed ?Patient identity confirmed: verbally with patient ?Preparation: Patient was prepped and draped in the usual sterile fashion. ?Local anesthesia used: no ? ?Anesthesia: ?Local anesthesia used: no ? ?Sedation: ?Patient sedated: no ? ?Comments: Unable to reduce right inguinal hernia despite multiple attempts. ? ?  ? ? ?Medications Ordered in ED ?Medications  ?sodium chloride 0.9 % bolus 1,000 mL (has no administration in time range)  ?HYDROmorphone (DILAUDID) injection 0.5 mg (has no administration in time range)  ? ? ?ED Course/ Medical Decision Making/ A&P ?  ?                        ?Medical Decision Making ?Patient with right-sided inguinal hernia. ?Both myself and Dr. Gwenlyn Fudge tried multiple times to reduce the hernia without success.  PA Morrie Sheldon of surgery has also attempted reduction.  He is currently going to discuss the case with his attending Dr. Gerrit Friends and will follow-up with PA Redwine who is taking the patient in signout for further lands. ? ?Risk ?Prescription drug management. ? ? ? ? ? ? ? ? ? ? ?Final Clinical Impression(s) / ED Diagnoses ?Final diagnoses:  ?None  ? ? ?Rx / DC Orders ?ED Discharge Orders   ? ? None  ? ?  ? ? ?  ?Arthor Captain, PA-C ?04/10/22 1544 ? ?  ?Pricilla Loveless, MD ?04/13/22 9509 ? ?

## 2022-04-10 NOTE — H&P (Signed)
? ? ? ?Jim Moran ?1961-07-22  ?[id number].   ? ?Requesting MD: Arthor Captain, PA-C ?Chief Complaint/Reason for Consult: R inguinal hernia  ? ?HPI: Jim Moran is a 61 y.o. male who presented to the ED on 4/28 for a hernia. ? ?Patient was recently admitted from 4/17 - 4/25 for sigmoid diverticulitis w/ abscess s/p IR drain placement on 4/21. He was discharged on Augmentin and has been taking it as prescribed. Reports he was doing well since discharge with improving LLQ pain (still having some soreness around the drain). He is tolerating his diet without n/v. His drain has remained SS w/ < 50cc output/day. He is scheduled to see IR on 5/10 and Dr. Michaell Cowing on 5/31.  ? ?He reports last night around 10pm he felt a pain and bulge in his right groin. He already had an appointment to see his pcp today for hospital follow up and asked his provider to evaluate. He was noted to have a R inguinal hernia that was unable to be reduced by the PCP and directed to the ED. EDMD and EDPA were unable to reduce this at bedside and asked our team to evaluate. He complains of moderate pain at the site of the hernia. No n/v. Tolerate a sip of water this am with his abx but otherwise has been npo. Passing flatus. Had a soft BM this am. Denies similar issues in the past. No prior surgical hx. He is not on blood thinners. His previous CT scans during last admission showed a R inguinal hernia containing fat.  ? ?ROS: ?Review of Systems  ?Constitutional:  Negative for chills and fever.  ?Respiratory:  Negative for shortness of breath.   ?Cardiovascular:  Negative for chest pain.  ?Gastrointestinal:  Positive for abdominal pain. Negative for constipation, diarrhea, nausea and vomiting.  ?Musculoskeletal:  Negative for back pain.  ?Psychiatric/Behavioral:  Negative for substance abuse.   ? ?Family History  ?Problem Relation Age of Onset  ? Leukemia Father 31  ? Diabetes Father   ? Cancer Father   ?     leukemia  ? ? ?Past Medical History:   ?Diagnosis Date  ? Chicken pox   ? Heart murmur   ? ? ?History reviewed. No pertinent surgical history. ? ?Social History:  reports that he quit smoking about 13 years ago. His smoking use included cigarettes. He has a 10.00 pack-year smoking history. He does not have any smokeless tobacco history on file. He reports current alcohol use. He reports that he does not use drugs. ?No tobacco use ?Usually drinks a few beers daily but has not drank for the last 2 weeks ?Works for [employer] as a Merchandiser, retail  ?Allergies:  ?Allergies  ?Allergen Reactions  ? Morphine Other (See Comments)  ?  Hallucinations  ? ? ?(Not in a hospital admission) ? ? ? ?Physical Exam: ?Blood pressure 123/81, pulse 76, temperature 98.2 ?F (36.8 ?C), temperature source Oral, resp. rate 18, SpO2 95 %. ?General: pleasant, WD/WN white male who is laying in bed in NAD ?HEENT: head is normocephalic, atraumatic.  Sclera are noninjected.  PERRL.  Ears and nose without any masses or lesions.  Mouth is pink and moist. Dentition fair ?Heart: regular, rate, and rhythm.  Normal s1,s2. No obvious murmurs, gallops, or rubs noted.  Palpable pedal pulses bilaterally  ?Lungs: CTAB, no wheezes, rhonchi, or rales noted.  Respiratory effort nonlabored ?Abd:  Soft, ND, very mild LLQ around IR drain - otherwise NT. No peritonitis. IR drain SS. +BS.  R inguinal hernia with palpable bulge that is not able to reduced by myself or Dr. Gerrit FriendsGerkin.  ?MS: no BUE/BLE edema, calves soft and nontender ?Skin: warm and dry with no masses, lesions, or rashes ?Psych: A&Ox4 with an appropriate affect ?Neuro: cranial nerves grossly intact, normal speech, thought process intact, moves all extremities, gait not assessed ? ? ?Results for orders placed or performed during the hospital encounter of 04/10/22 (from the past 48 hour(s))  ?Urinalysis, Routine w reflex microscopic Urine, Clean Catch     Status: None  ? Collection Time: 04/10/22  1:39 PM  ?Result Value Ref Range  ? Color,  Urine YELLOW YELLOW  ? APPearance CLEAR CLEAR  ? Specific Gravity, Urine 1.015 1.005 - 1.030  ? pH 7.0 5.0 - 8.0  ? Glucose, UA NEGATIVE NEGATIVE mg/dL  ? Hgb urine dipstick NEGATIVE NEGATIVE  ? Bilirubin Urine NEGATIVE NEGATIVE  ? Ketones, ur NEGATIVE NEGATIVE mg/dL  ? Protein, ur NEGATIVE NEGATIVE mg/dL  ? Nitrite NEGATIVE NEGATIVE  ? Leukocytes,Ua NEGATIVE NEGATIVE  ?  Comment: Performed at [facility], [address] [place], [place] [address]  ?Lipase, blood     Status: None  ? Collection Time: 04/10/22  1:40 PM  ?Result Value Ref Range  ? Lipase 32 11 - 51 U/L  ?  Comment: Performed at [facility], [address] [place], [place] [address]  ?Comprehensive metabolic panel     Status: Abnormal  ? Collection Time: 04/10/22  1:40 PM  ?Result Value Ref Range  ? Sodium 134 (L) 135 - 145 mmol/L  ? Potassium 4.6 3.5 - 5.1 mmol/L  ? Chloride 101 98 - 111 mmol/L  ? CO2 25 22 - 32 mmol/L  ? Glucose, Bld 100 (H) 70 - 99 mg/dL  ?  Comment: Glucose reference range applies only to samples taken after fasting for at least 8 hours.  ? BUN 17 6 - 20 mg/dL  ? Creatinine, Ser 1.16 0.61 - 1.24 mg/dL  ? Calcium 9.4 8.9 - 10.3 mg/dL  ? Total Protein 8.3 (H) 6.5 - 8.1 g/dL  ? Albumin 3.6 3.5 - 5.0 g/dL  ? AST 27 15 - 41 U/L  ? ALT 30 0 - 44 U/L  ? Alkaline Phosphatase 82 38 - 126 U/L  ? Total Bilirubin 0.4 0.3 - 1.2 mg/dL  ? GFR, Estimated >60 >60 mL/min  ?  Comment: (NOTE) ?Calculated using the CKD-EPI Creatinine Equation (2021) ?  ? Anion gap 8 5 - 15  ?  Comment: Performed at [facility], [address] [place], [place] [address]  ?CBC     Status: Abnormal  ? Collection Time: 04/10/22  1:40 PM  ?Result Value Ref Range  ? WBC 12.4 (H) 4.0 - 10.5 K/uL  ? RBC 4.24 4.22 - 5.81 MIL/uL  ? Hemoglobin 13.2 13.0 - 17.0 g/dL  ? HCT 41.0 39.0 - 52.0 %  ? MCV 96.7 80.0 - 100.0 fL  ? MCH 31.1 26.0 - 34.0 pg  ? MCHC 32.2 30.0 - 36.0 g/dL  ? RDW 12.9 11.5 - 15.5 %  ? Platelets 735  (H) 150 - 400 K/uL  ? nRBC 0.0 0.0 - 0.2 %  ?  Comment: Performed at [facility], [address] [place], [place] [address]  ? ?No results found. ? ?Anti-infectives (From admission, onward)  ? ? None  ? ?  ? ? ?Assessment/Plan ?R Inguinal Hernia  ?This is a 61 y.o. male  w/ a R inguinal hernia that is unable to be reduced at bedside. He was recently admitted on 4/17 - 4/25 for sigmoid diverticulitis w/ abscess s/p IR drain placement on 4/21 and is still taking IV abx. We will get CT scan to evaluate if there is bowel within the hernia. Discussed w/ radiology and this will be best evaluated without oral contrast. Discussed w/ patient we would ideally like to avoid repair of his hernia given he is still being treated for his diverticulitis; however if the CT scan shows bowel is incarcerated within the hernia he will likely require repair in the OR tonight. If the CT shows fat within the hernia, we will see if we can get his pain under control and over his diverticulitis before discussing repair. Further recs to follow after CT.  ? ?Jacinto Halim, PA-C ?[facility] ?04/10/2022, 3:54 PM ?Please see Amion for pager number during day hours 7:00am-4:30pm ? ?

## 2022-04-10 NOTE — ED Provider Triage Note (Signed)
Emergency Medicine Provider Triage Evaluation Note ? ?Jim Moran , a 61 y.o. male  was evaluated in triage.  Pt complains of right inguinal hernia.  A week ago patient was in the hospital and diagnosed with diverticulitis that was associated with an abscess.  He had a drain placed and was to have surgery with Dr. Michaell Cowing.  Over the past 2 days he has developed severe right lower abdominal pain.  He went to his PCP today who determined it was a nonreducible hernia.  They called Dr. Michaell Cowing and were told that he needed to come to the emergency department to be medicated for a possible reduction. ? ?Review of Systems  ?Positive: Abdominal pain and groin pain ?Negative: Fever and chills, nausea and vomiting ? ?Physical Exam  ?BP 117/77 (BP Location: Left Arm)   Pulse (!) 101   Temp 98.2 ?F (36.8 ?C) (Oral)   Resp 18   SpO2 99%  ?Gen:   Awake, no distress   ?Resp:  Normal effort  ?MSK:   Moves extremities without difficulty  ?Other:  Left-sided drain with serosanguineous fluid, does not appear infected.  Right-sided groin swelling ? ?Medical Decision Making  ?Medically screening exam initiated at 1:04 PM.  Appropriate orders placed.  Jim Moran was informed that the remainder of the evaluation will be completed by another provider, this initial triage assessment does not replace that evaluation, and the importance of remaining in the ED until their evaluation is complete. ? ? ?  ?Jim Benders, PA-C ?04/10/22 1312 ? ?

## 2022-04-10 NOTE — ED Notes (Signed)
ED TO INPATIENT HANDOFF REPORT ? ?ED Nurse Name and Phone #: Delice Bison, RN ? ?S ?Name/Age/Gender ?Jim Moran ?61 y.o. ?male ?Room/Bed: WA15/WA15 ? ?Code Status ?  Code Status: Prior ? ?Home/SNF/Other ?Home ?Patient oriented to: self, place, time, and situation ?Is this baseline? Yes  ? ?Triage Complete: Triage complete  ?Chief Complaint ?Perforation of sigmoid colon due to diverticulitis [K57.20] ? ?Triage Note ?Pt c/o hernial pain on the right groin. He was sent by PCP who unsuccessfully attempted to push hernia back in. PCP instructed pt to come to ED. Patient was recently [facility] from [facility] after being admitted for an abscess on his colon which was drained.   ? ?Allergies ?Allergies  ?Allergen Reactions  ? Morphine Other (See Comments)  ?  Hallucinations  ? ? ?Level of Care/Admitting Diagnosis ?ED Disposition   ? ? ED Disposition  ?Admit  ? Condition  ?--  ? Comment  ?Hospital Area: [facility] [[id number]] ? Level of Care: Med-Surg [16] ? May admit patient to [facility] or [facility] if equivalent level of care is available:: No ? Covid Evaluation: Asymptomatic - no recent exposure (last 10 days) testing not required ? Diagnosis: Perforation of sigmoid colon due to diverticulitis [6073710] ? Admitting Physician: CCS, MD [[id number]] ? Attending Physician: CCS, MD [[id number]] ? Estimated length of stay: past midnight tomorrow ? Certification:: I certify this patient will need inpatient services for at least 2 midnights ?  ?  ? ?  ? ? ?B ?Medical/Surgery History ?Past Medical History:  ?Diagnosis Date  ? Chicken pox   ? Heart murmur   ? ?History reviewed. No pertinent surgical history.  ? ?A ?IV Location/Drains/Wounds ?Patient Lines/Drains/Airways Status   ? ? Active Line/Drains/Airways   ? ? Name Placement date Placement time Site Days  ? Peripheral IV 04/10/22 20 G 1" Anterior;Left Forearm 04/10/22  1340  Forearm  less than 1  ? Closed System Drain 1 Lateral;Left LLQ Other (Comment) 12 Fr. 04/03/22  1712   LLQ  7  ? ?  ?  ? ?  ? ? ?Intake/Output Last 24 hours ? ?Intake/Output Summary (Last 24 hours) at 04/10/2022 1853 ?Last data filed at 04/10/2022 1533 ?Gross per 24 hour  ?Intake 1000 ml  ?Output --  ?Net 1000 ml  ? ? ?Labs/Imaging ?Results for orders placed or performed during the hospital encounter of 04/10/22 (from the past 48 hour(s))  ?Urinalysis, Routine w reflex microscopic Urine, Clean Catch     Status: None  ? Collection Time: 04/10/22  1:39 PM  ?Result Value Ref Range  ? Color, Urine YELLOW YELLOW  ? APPearance CLEAR CLEAR  ? Specific Gravity, Urine 1.015 1.005 - 1.030  ? pH 7.0 5.0 - 8.0  ? Glucose, UA NEGATIVE NEGATIVE mg/dL  ? Hgb urine dipstick NEGATIVE NEGATIVE  ? Bilirubin Urine NEGATIVE NEGATIVE  ? Ketones, ur NEGATIVE NEGATIVE mg/dL  ? Protein, ur NEGATIVE NEGATIVE mg/dL  ? Nitrite NEGATIVE NEGATIVE  ? Leukocytes,Ua NEGATIVE NEGATIVE  ?  Comment: Performed at [facility], [address], [place], [place] [address]  ?Lipase, blood     Status: None  ? Collection Time: 04/10/22  1:40 PM  ?Result Value Ref Range  ? Lipase 32 11 - 51 U/L  ?  Comment: Performed at [facility], [address], [place], [place] [address]  ?Comprehensive metabolic panel     Status: Abnormal  ? Collection Time: 04/10/22  1:40 PM  ?Result Value Ref Range  ?  Sodium 134 (L) 135 - 145 mmol/L  ? Potassium 4.6 3.5 - 5.1 mmol/L  ? Chloride 101 98 - 111 mmol/L  ? CO2 25 22 - 32 mmol/L  ? Glucose, Bld 100 (H) 70 - 99 mg/dL  ?  Comment: Glucose reference range applies only to samples taken after fasting for at least 8 hours.  ? BUN 17 6 - 20 mg/dL  ? Creatinine, Ser 1.16 0.61 - 1.24 mg/dL  ? Calcium 9.4 8.9 - 10.3 mg/dL  ? Total Protein 8.3 (H) 6.5 - 8.1 g/dL  ? Albumin 3.6 3.5 - 5.0 g/dL  ? AST 27 15 - 41 U/L  ? ALT 30 0 - 44 U/L  ? Alkaline Phosphatase 82 38 - 126 U/L  ? Total Bilirubin 0.4 0.3 - 1.2 mg/dL  ? GFR, Estimated >60 >60 mL/min  ?  Comment: (NOTE) ?Calculated using the CKD-EPI  Creatinine Equation (2021) ?  ? Anion gap 8 5 - 15  ?  Comment: Performed at [facility], [address], [place], [place] [address]  ?CBC     Status: Abnormal  ? Collection Time: 04/10/22  1:40 PM  ?Result Value Ref Range  ? WBC 12.4 (H) 4.0 - 10.5 K/uL  ? RBC 4.24 4.22 - 5.81 MIL/uL  ? Hemoglobin 13.2 13.0 - 17.0 g/dL  ? HCT 41.0 39.0 - 52.0 %  ? MCV 96.7 80.0 - 100.0 fL  ? MCH 31.1 26.0 - 34.0 pg  ? MCHC 32.2 30.0 - 36.0 g/dL  ? RDW 12.9 11.5 - 15.5 %  ? Platelets 735 (H) 150 - 400 K/uL  ? nRBC 0.0 0.0 - 0.2 %  ?  Comment: Performed at [facility], [address], [place], [place] [address]  ? ?CT ABDOMEN PELVIS W CONTRAST ? ?Result Date: 04/10/2022 ?CLINICAL DATA:  Pain right inguinal region EXAM: CT ABDOMEN AND PELVIS WITH CONTRAST TECHNIQUE: Multidetector CT imaging of the abdomen and pelvis was performed using the standard protocol following bolus administration of intravenous contrast. RADIATION DOSE REDUCTION: This exam was performed according to the departmental dose-optimization program which includes automated exposure control, adjustment of the mA and/or kV according to patient size and/or use of iterative reconstruction technique. CONTRAST:  OMNIPAQUE IOHEXOL 300 MG/ML  SOLN COMPARISON:  04/03/2022 FINDINGS: Lower chest: Linear density is seen in the posterior right lower lung fields suggesting subsegmental atelectasis. There is no pleural effusion. Hepatobiliary: There is fatty infiltration in the liver. There is no dilation of bile ducts. Gallbladder is distended, possibly due to fasting state. There is no significant wall thickening in gallbladder. Pancreas: No focal abnormality is seen. Spleen: There are coarse calcifications in the spleen with no significant interval change. Adrenals/Urinary Tract: Adrenals are unremarkable. There is no hydronephrosis. There are no renal or ureteral stones. Urinary bladder is unremarkable. Stomach/Bowel: Stomach is not  distended. Small bowel loops are not dilated. Appendix is not dilated. There is contrast in the lumen of appendix. There is no pericecal stranding. Scattered diverticula are seen in the colon. There is interval decrease in wall thickening and pericolic stranding in the sigmoid colon. There is a pigtail percutaneous drainage catheter with its tip posterior to the sigmoid colon. There is interval resolution of pericolic abscess posterior to the sigmoid after placement of the percutaneous catheter. There is new loculated fluid collection with thick wall adjacent to the lateral margin of inferior descending colon measuring 3.2 x 2 cm suggesting new pericolic abscess. Vascular/Lymphatic: There are scattered  arterial calcifications. Reproductive: Unremarkable. Other: There is no ascites or pneumoperitoneum. Small umbilical hernia containing fat is seen. Small left inguinal hernia containing fat is seen. There is moderate to large right inguinal hernia with hernial sac measuring 6.3 x 5.4 cm. There is interval appearance of stranding within the hernial sac. There is 2.8 x 2.5 cm loculated thick-walled fluid collection in the right inguinal hernial sac. Inflammatory stranding in the right inguinal hernia is extending to the anterior margin of urinary bladder and to the sigmoid colon. Musculoskeletal: Unremarkable. IMPRESSION: There is moderate to large sized right inguinal hernia. There is interval appearance of stranding in the fat planes within the right inguinal hernia. There is interval appearance of 2.8 cm loculated thick-walled fluid collection in the right inguinal hernial sac, possibly an abscess. Possibility of extension of infectious process from sigmoid diverticulitis into the right inguinal hernial sac is not excluded. There is interval clearing of pericolic abscess posterior to the sigmoid colon after placement of percutaneous drainage catheter. There is a new 3.2 cm pericolic abscess with thick wall lateral to  the inferior descending colon at the level left iliac crest. There is no evidence of intestinal obstruction or pneumoperitoneum. There is no hydronephrosis. Small linear infiltrate in the posterior right lowe

## 2022-04-11 LAB — CBC
HCT: 36.2 % — ABNORMAL LOW (ref 39.0–52.0)
Hemoglobin: 12.2 g/dL — ABNORMAL LOW (ref 13.0–17.0)
MCH: 32.4 pg (ref 26.0–34.0)
MCHC: 33.7 g/dL (ref 30.0–36.0)
MCV: 96 fL (ref 80.0–100.0)
Platelets: 658 10*3/uL — ABNORMAL HIGH (ref 150–400)
RBC: 3.77 MIL/uL — ABNORMAL LOW (ref 4.22–5.81)
RDW: 12.8 % (ref 11.5–15.5)
WBC: 9.6 10*3/uL (ref 4.0–10.5)
nRBC: 0 % (ref 0.0–0.2)

## 2022-04-11 LAB — BASIC METABOLIC PANEL
Anion gap: 9 (ref 5–15)
BUN: 13 mg/dL (ref 6–20)
CO2: 26 mmol/L (ref 22–32)
Calcium: 9.2 mg/dL (ref 8.9–10.3)
Chloride: 102 mmol/L (ref 98–111)
Creatinine, Ser: 0.94 mg/dL (ref 0.61–1.24)
GFR, Estimated: >60 mL/min (ref >60)
Glucose, Bld: 99 mg/dL (ref 70–99)
Potassium: 4.3 mmol/L (ref 3.5–5.1)
Sodium: 137 mmol/L (ref 135–145)

## 2022-04-11 MED ORDER — METHOCARBAMOL 500 MG PO TABS: 500.0000 mg | ORAL_TABLET | Freq: Three times a day (TID) | ORAL | Status: DC | PRN

## 2022-04-11 NOTE — Progress Notes (Signed)
? ?Subjective/Chief Complaint: ?PT in good spirits  ?Still tender in right groin but no worse ?Abdomen less tender  ?CT shows fluid in the right hernia sac ?abscess from diverticulitis  ?No fever or chills  ? ?Objective: ?Vital signs in last 24 hours: ?Temp:  [97.7 ?F (36.5 ?C)-98.2 ?F (36.8 ?C)] 97.7 ?F (36.5 ?C) (04/29 0536) ?Pulse Rate:  [70-101] 72 (04/29 0536) ?Resp:  [14-18] 14 (04/29 0536) ?BP: (113-145)/(77-111) 134/84 (04/29 0536) ?SpO2:  [95 %-99 %] 97 % (04/29 0536) ?Weight:  [81.6 kg] 81.6 kg (04/29 0700) ?  ? ?Intake/Output from previous day: ?04/28 0701 - 04/29 0700 ?In: 1837.3 [P.O.:120; I.V.:667.7; IV Piggyback:1049.6] ?Out: -  ?Intake/Output this shift: ?No intake/output data recorded. ? ? ?General: pleasant, WD/WN white male who is laying in bed in NAD ?HEENT: head is normocephalic, atraumatic.  Sclera are noninjected.  PERRL.  Ears and nose without any masses or lesions.  Mouth is pink and moist. Dentition fair ?Heart: regular, rate, and rhythm.  Normal s1,s2. No obvious murmurs, gallops, or rubs noted.  Palpable pedal pulses bilaterally  ?Lungs: CTAB, no wheezes, rhonchi, or rales noted.  Respiratory effort nonlabored ?Abd:  Soft, ND, very mild LLQ around IR drain - otherwise NT. No peritonitis. IR drain SS. +BS. R inguinal hernia with palpable bulge that is not able to reduced no erythema or sub Q air  ?MS: no BUE/BLE edema, calves soft and nontender ?Skin: warm and dry with no masses, lesions, or rashes ?Psych: A&Ox4 with an appropriate affect ?Neuro: cranial nerves grossly intact, normal speech, thought process intact, moves all extremities, gait not assessed ?Lab Results:  ?Recent Labs  ?  04/10/22 ?1340 04/11/22 ?0355  ?WBC 12.4* 9.6  ?HGB 13.2 12.2*  ?HCT 41.0 36.2*  ?PLT 735* 658*  ? ?BMET ?Recent Labs  ?  04/10/22 ?1340 04/11/22 ?0355  ?NA 134* 137  ?K 4.6 4.3  ?CL 101 102  ?CO2 25 26  ?GLUCOSE 100* 99  ?BUN 17 13  ?CREATININE 1.16 0.94  ?CALCIUM 9.4 9.2  ? ?PT/INR ?No results for  input(s): LABPROT, INR in the last 72 hours. ?ABG ?No results for input(s): PHART, HCO3 in the last 72 hours. ? ?Invalid input(s): PCO2, PO2 ? ?Studies/Results: ?CT ABDOMEN PELVIS W CONTRAST ? ?Result Date: 04/10/2022 ?CLINICAL DATA:  Pain right inguinal region EXAM: CT ABDOMEN AND PELVIS WITH CONTRAST TECHNIQUE: Multidetector CT imaging of the abdomen and pelvis was performed using the standard protocol following bolus administration of intravenous contrast. RADIATION DOSE REDUCTION: This exam was performed according to the departmental dose-optimization program which includes automated exposure control, adjustment of the mA and/or kV according to patient size and/or use of iterative reconstruction technique. CONTRAST:  144mL OMNIPAQUE IOHEXOL 300 MG/ML  SOLN COMPARISON:  04/03/2022 FINDINGS: Lower chest: Linear density is seen in the posterior right lower lung fields suggesting subsegmental atelectasis. There is no pleural effusion. Hepatobiliary: There is fatty infiltration in the liver. There is no dilation of bile ducts. Gallbladder is distended, possibly due to fasting state. There is no significant wall thickening in gallbladder. Pancreas: No focal abnormality is seen. Spleen: There are coarse calcifications in the spleen with no significant interval change. Adrenals/Urinary Tract: Adrenals are unremarkable. There is no hydronephrosis. There are no renal or ureteral stones. Urinary bladder is unremarkable. Stomach/Bowel: Stomach is not distended. Small bowel loops are not dilated. Appendix is not dilated. There is contrast in the lumen of appendix. There is no pericecal stranding. Scattered diverticula are seen in the colon. There  is interval decrease in wall thickening and pericolic stranding in the sigmoid colon. There is a pigtail percutaneous drainage catheter with its tip posterior to the sigmoid colon. There is interval resolution of pericolic abscess posterior to the sigmoid after placement of the  percutaneous catheter. There is new loculated fluid collection with thick wall adjacent to the lateral margin of inferior descending colon measuring 3.2 x 2 cm suggesting new pericolic abscess. Vascular/Lymphatic: There are scattered arterial calcifications. Reproductive: Unremarkable. Other: There is no ascites or pneumoperitoneum. Small umbilical hernia containing fat is seen. Small left inguinal hernia containing fat is seen. There is moderate to large right inguinal hernia with hernial sac measuring 6.3 x 5.4 cm. There is interval appearance of stranding within the hernial sac. There is 2.8 x 2.5 cm loculated thick-walled fluid collection in the right inguinal hernial sac. Inflammatory stranding in the right inguinal hernia is extending to the anterior margin of urinary bladder and to the sigmoid colon. Musculoskeletal: Unremarkable. IMPRESSION: There is moderate to large sized right inguinal hernia. There is interval appearance of stranding in the fat planes within the right inguinal hernia. There is interval appearance of 2.8 cm loculated thick-walled fluid collection in the right inguinal hernial sac, possibly an abscess. Possibility of extension of infectious process from sigmoid diverticulitis into the right inguinal hernial sac is not excluded. There is interval clearing of pericolic abscess posterior to the sigmoid colon after placement of percutaneous drainage catheter. There is a new 3.2 cm pericolic abscess with thick wall lateral to the inferior descending colon at the level left iliac crest. There is no evidence of intestinal obstruction or pneumoperitoneum. There is no hydronephrosis. Small linear infiltrate in the posterior right lower lung fields suggests subsegmental atelectasis. Fatty infiltration is seen in the liver. Other findings as described in the body of the report. Electronically Signed   By: Elmer Picker M.D.   On: 04/10/2022 18:22   ? ?Anti-infectives: ?Anti-infectives (From  admission, onward)  ? ? Start     Dose/Rate Route Frequency Ordered Stop  ? 04/10/22 2100  piperacillin-tazobactam (ZOSYN) IVPB 3.375 g       ? 3.375 g ?12.5 mL/hr over 240 Minutes Intravenous Every 8 hours 04/10/22 2008 04/17/22 2159  ? ?  ? ? ?Assessment/Plan: ?R Inguinal Hernia  ?This is a 61 y.o. male w/ a R inguinal hernia that is unable to be reduced at bedside. He was recently admitted on 4/17 - 4/25 for sigmoid diverticulitis w/ abscess s/p IR drain placement on 4/21 and is still taking IV abx.  ? ?RIH noted no bowel or other contents notes but fluid in it - could be reactive vs early abscess from diverticulitis  ? ?No redness and WBC normal ?Continue ABX for now  ?If no improvement over the next 48 hours, may need drainage in OR and repair at a later time  ? ? ?Diverticulitis- IR drain in place - cont ABX  ? ?Advance diet for now  ?Add robaxin for discomfort .  ? ? LOS: 1 day  ? ?Total time 45 min face to face/ non face to face documentation and chart lab review  ?Turner Daniels MD  ?04/11/2022 ? ?

## 2022-04-11 NOTE — TOC Initial Note (Signed)
Transition of Care (TOC) - Initial/Assessment Note  ? ? ?Patient Details  ?Name: Jim Moran ?MRN: [id number] ?Date of Birth: [birth year] ? ?Transition of Care (TOC) CM/SW Contact:    ?Tawanna Cooler, RN ?Phone Number: ?04/11/2022, 4:10 PM ? ?Clinical Narrative:                 ? ? ?Transition of Care Department (TOC) has reviewed patient and no TOC needs have been identified at this time. We will continue to monitor patient advancement through interdisciplinary progression rounds. If new patient transition needs arise, please place a TOC consult. ? ? ? ?Expected Discharge Plan: Home/Self Care ?Barriers to Discharge: Continued Medical Work up ? ?

## 2022-04-12 LAB — CBC
HCT: 38.2 % — ABNORMAL LOW (ref 39.0–52.0)
Hemoglobin: 12.3 g/dL — ABNORMAL LOW (ref 13.0–17.0)
MCH: 31.3 pg (ref 26.0–34.0)
MCHC: 32.2 g/dL (ref 30.0–36.0)
MCV: 97.2 fL (ref 80.0–100.0)
Platelets: 679 10*3/uL — ABNORMAL HIGH (ref 150–400)
RBC: 3.93 MIL/uL — ABNORMAL LOW (ref 4.22–5.81)
RDW: 12.8 % (ref 11.5–15.5)
WBC: 9.1 10*3/uL (ref 4.0–10.5)
nRBC: 0 % (ref 0.0–0.2)

## 2022-04-12 NOTE — Progress Notes (Signed)
NT approached desk about 15 mins ago and said "pt in 1507 Is gone." Upon assessment of room, IV was out in trash, pt was gone w/ IR drain in place yet belongings remain in room. AC and security was immediately informed and Dr. Harlon Flor aware via phone. Awaiting Dr. Luisa Hart to round. ?

## 2022-04-12 NOTE — Progress Notes (Signed)
Patient left to the room without noticed hospital staff. He pulled his iv and left to the trash bin. He left his cloth in the room. Notified MD, [facility] security and patient's wife. Fill out the [facility]  form with witness Charge Nurse  Nevin Bloodgood and  left in the patient's chart. Patient  is not available to sign the AMA form. ?

## 2022-04-13 ENCOUNTER — Encounter: Payer: Self-pay | Admitting: Surgery

## 2022-04-13 DIAGNOSIS — K4090 Unilateral inguinal hernia, without obstruction or gangrene, not specified as recurrent: Secondary | ICD-10-CM | POA: Insufficient documentation

## 2022-04-14 NOTE — Discharge Summary (Signed)
The patient left AMA without talking to nursing or physicians.  He pulled out his own IV and apparently left via the stairs. ? ?His wife was alerted. ? ?Jim Moran. Zigmund Linse, MD, FACS ?[facility]  ?General Surgery ? ? ?04/14/2022 ?1:21 PM ? ?

## 2022-04-20 ENCOUNTER — Other Ambulatory Visit: Payer: Self-pay | Admitting: Urology

## 2022-04-22 ENCOUNTER — Ambulatory Visit
Admission: RE | Admit: 2022-04-22 | Discharge: 2022-04-22 | Disposition: A | Discharge status: Home / Self Care | Payer: BC Managed Care – PPO | Source: Ambulatory Visit | Attending: Surgery | Admitting: Surgery

## 2022-04-22 ENCOUNTER — Encounter: Payer: Self-pay | Admitting: *Deleted

## 2022-04-22 ENCOUNTER — Ambulatory Visit
Admission: RE | Admit: 2022-04-22 | Discharge: 2022-04-22 | Disposition: A | Discharge status: Home / Self Care | Payer: BC Managed Care – PPO | Source: Ambulatory Visit | Attending: Student | Admitting: Student

## 2022-04-22 DIAGNOSIS — K5720 Diverticulitis of large intestine with perforation and abscess without bleeding: Secondary | ICD-10-CM

## 2022-04-22 HISTORY — PX: IR RADIOLOGIST EVAL & MGMT: IMG5224

## 2022-04-22 MED ORDER — IOPAMIDOL (ISOVUE-300) INJECTION 61%
100.0000 mL | Freq: Once | INTRAVENOUS | Status: AC | PRN
  Administered 2022-04-22: 100 mL via INTRAVENOUS

## 2022-04-22 NOTE — Progress Notes (Signed)
? ?Referring Physician(s): ?Covington,Jamie R ? ?Chief Complaint: ?The patient is seen in follow up today s/p diverticular abscess drain placed 04/03/22 by Dr. Pascal Lux ? ?History of present illness: ? ?Patient presented to [facility] ED on 03/30/22 due to abdominal pain for a week.  Underwent CT AP with on 4/17 which showed acute diverticulitis of sigmoid colon and left lateral sigmoid colon wall abscess. Patient was hospitalized for further eval and management, general surgery was consulted and patient was been treated with abx.  Repeat CT on 4/21 showed persistent intraabdominal abscess despite abx treatment. IR was requested for image guided drain placement for the intraabdominal abscess. ?He was discharged on 4/25 and presented to ED due to bulge in right groin, determined to be hernia containing portions of bladder as well as an additional fluid collection.  At this time, interval improvement in left fluid collection where drain was placed.  Pt left AMA this admission. ?At home, he reports doing well.  His wife has helped him with drain care.  Denies nausea, vomiting, fever or chills.  Endorses taking antibiotics.  Denies abdominal pain or drain site leakage.  Has surgery scheduled with Dr. Johney Maine at end of month.   ? ?Past Medical History:  ?Diagnosis Date  ? Chicken pox   ? Heart murmur   ? ? ?No past surgical history on file. ? ?Allergies: ?Oxycodone and Morphine ? ?Medications: ?Prior to Admission medications   ?Medication Sig Start Date End Date Taking? Authorizing Provider  ?amLODipine (NORVASC) 5 MG tablet Take 5 mg by mouth daily. 02/27/22   [provider]  ?aspirin 81 MG tablet Take 81 mg by mouth 2 (two) times a week.    [provider]  ?cyclobenzaprine (FLEXERIL) 10 MG tablet Take 10 mg by mouth 3 (three) times daily as needed for muscle spasms.    [provider]  ?lisinopril (ZESTRIL) 20 MG tablet Take 20 mg by mouth daily. 02/27/22   [provider]  ?polyethylene glycol  (MIRALAX) 17 g packet Take 17 g by mouth daily as needed. ?Patient taking differently: Take 17 g by mouth daily as needed for moderate constipation. 04/07/22   Elodia Florence., MD  ?sodium chloride flush (NS) 0.9 % SOLN 5 mLs by Intracatheter route daily. 04/07/22   Norm Parcel, PA-C  ?TYLENOL PM EXTRA STRENGTH 500-25 MG TABS tablet Take 1 tablet by mouth at bedtime as needed (pain).    [provider]  ?  ? ?Family History  ?Problem Relation Age of Onset  ? Leukemia Father 57  ? Diabetes Father   ? Cancer Father   ?     leukemia  ? ? ?Social History  ? ?Socioeconomic History  ? Marital status: Married  ?  Spouse name: Not on file  ? Number of children: Not on file  ? Years of education: Not on file  ? Highest education level: Not on file  ?Occupational History  ? Not on file  ?Tobacco Use  ? Smoking status: Former  ?  Packs/day: 0.50  ?  Years: 20.00  ?  Pack years: 10.00  ?  Types: Cigarettes  ?  Quit date: 12/15/2008  ?  Years since quitting: 13.3  ? Smokeless tobacco: Not on file  ?Substance and Sexual Activity  ? Alcohol use: Yes  ?  Comment: Occasionally  ? Drug use: Never  ? Sexual activity: Not on file  ?Other Topics Concern  ? Not on file  ?Social History Narrative  ?  Not on file  ? ?Social Determinants of Health  ? ?Financial Resource Strain: Not on file  ?Food Insecurity: Not on file  ?Transportation Needs: Not on file  ?Physical Activity: Not on file  ?Stress: Not on file  ?Social Connections: Not on file  ? ? ? ?Vital Signs: ?There were no vitals taken for this visit. ? ?Physical Exam ?Constitutional:   ?   General: He is not in acute distress. ?   Appearance: He is not ill-appearing.  ?HENT:  ?   Head: Normocephalic and atraumatic.  ?   Nose: Nose normal.  ?Eyes:  ?   Extraocular Movements: Extraocular movements intact.  ?Pulmonary:  ?   Effort: Pulmonary effort is normal. No respiratory distress.  ?Abdominal:  ?   General: Abdomen is flat.  ?   Palpations: Abdomen is soft.  ?Skin: ?    General: Skin is warm and dry.  ?Neurological:  ?   General: No focal deficit present.  ?   Mental Status: He is alert.  ?Psychiatric:     ?   Mood and Affect: Mood normal.     ?   Thought Content: Thought content normal.  ?Drain: catheter secured with suture and statlock.  Site is C/D/I.  Scant bloody output in bag. ? ?Imaging: ?No results found. ? ?Labs: ? ?CBC: ?Recent Labs  ?  04/07/22 ?[id number] 04/10/22 ?1340 04/11/22 ?0355 04/12/22 ?0355  ?WBC 13.7* 12.4* 9.6 9.1  ?HGB 11.2* 13.2 12.2* 12.3*  ?HCT 33.9* 41.0 36.2* 38.2*  ?PLT 563* 735* 658* 679*  ? ? ?COAGS: ?Recent Labs  ?  03/30/22 ?1116  ?INR 1.0  ?APTT 35  ? ? ?BMP: ?Recent Labs  ?  04/05/22 ?0420 04/06/22 ?0356 04/10/22 ?1340 04/11/22 ?0355  ?NA 133* 135 134* 137  ?K 4.3 4.9 4.6 4.3  ?CL 98 101 101 102  ?CO2 27 28 25 26   ?GLUCOSE 112* 116* 100* 99  ?BUN 17 16 17 13   ?CALCIUM 8.7* 8.4* 9.4 9.2  ?CREATININE 1.07 0.94 1.16 0.94  ?GFRNONAA >60 >60 >60 >60  ? ? ?LIVER FUNCTION TESTS: ?Recent Labs  ?  04/01/22 ?0440 04/04/22 ?0407 04/05/22 ?0420 04/10/22 ?1340  ?BILITOT 0.6 0.6 0.5 0.4  ?AST 17 30 23 27   ?ALT 16 28 23 30   ?ALKPHOS 63 64 62 82  ?PROT 6.5 6.9 6.4* 8.3*  ?ALBUMIN 3.2* 3.2* 2.9* 3.6  ? ? ?Assessment: ? ?Diverticular abscess: ?--s/p drain placement on 04/03/22 ?--clinically improved ?--CT suggests resolution ?--Drain removed in clinic today without event ?--Advised to keep scheduled f/u appointments with surgeon ?--Instructed on wound care ? ? ?Signed: Pasty Spillers, PA ?04/22/2022, 1:51 PM ? ? ?Please refer to Dr. Earleen Newport attestation of this note for management and plan.  ? ? ? ? ?  ?

## 2022-05-28 NOTE — Patient Instructions (Addendum)
DUE TO COVID-19 ONLY TWO VISITORS  (aged 59 and older)  ARE ALLOWED TO COME WITH YOU AND STAY IN THE WAITING ROOM ONLY DURING PRE OP AND PROCEDURE.   **NO VISITORS ARE ALLOWED IN THE SHORT STAY AREA OR RECOVERY ROOM!!**  IF YOU WILL BE ADMITTED INTO THE HOSPITAL YOU ARE ALLOWED ONLY FOUR SUPPORT PEOPLE DURING VISITATION HOURS ONLY (7 AM -8PM)   The support person(s) must pass our screening, gel in and out, and wear a mask at all times, including in the patient's room. Patients must also wear a mask when staff or their support person are in the room. Visitors GUEST BADGE MUST BE WORN VISIBLY  One adult visitor may remain with you overnight and MUST be in the room by 8 P.M.     Your procedure is scheduled on: 06/04/22   Report to [facility] Main Entrance    Report to admitting at: 8:15 AM   Call this number if you have problems the morning of surgery [phone]   Clear liquids starting the day before surgery until : 7:30 AM DAY OF SURGERY  Water Black Coffee (sugar ok, NO MILK/CREAM OR CREAMERS)  Tea (sugar ok, NO MILK/CREAM OR CREAMERS) regular and decaf                             Plain Jell-O (NO RED)                                           Fruit ices (not with fruit pulp, NO RED)                                     Popsicles (NO RED)                                                                  Juice: apple, WHITE grape, WHITE cranberry Sports drinks like Gatorade (NO RED) Clear broth(vegetable,chicken,beef)  FOLLOW BOWEL PREP AND ANY ADDITIONAL PRE OP INSTRUCTIONS YOU RECEIVED FROM YOUR SURGEON'S OFFICE!!!   Oral Hygiene is also important to reduce your risk of infection.                                    Remember - BRUSH YOUR TEETH THE MORNING OF SURGERY WITH YOUR REGULAR TOOTHPASTE   Do NOT smoke after Midnight   Take these medicines the morning of surgery with A SIP OF WATER: amlodipine.  DO NOT TAKE ANY ORAL DIABETIC MEDICATIONS DAY OF YOUR  SURGERY  Bring CPAP mask and tubing day of surgery.                              You may not have any metal on your body including hair pins, jewelry, and body piercing             Do not wear  lotions, powders, perfumes/cologne, or deodorant  Men may shave face and neck.   Do not bring valuables to the hospital. [facility] IS NOT             RESPONSIBLE   FOR VALUABLES.   Contacts, dentures or bridgework may not be worn into surgery.   Bring small overnight bag day of surgery.   DO NOT BRING YOUR HOME MEDICATIONS TO THE HOSPITAL. PHARMACY WILL DISPENSE MEDICATIONS LISTED ON YOUR MEDICATION LIST TO YOU DURING YOUR ADMISSION IN THE HOSPITAL!    Patients discharged on the day of surgery will not be allowed to drive home.  Someone NEEDS to stay with you for the first 24 hours after anesthesia.   Special Instructions: Bring a copy of your healthcare power of attorney and living will documents         the day of surgery if you haven't scanned them before.              Please read over the following fact sheets you were given: IF YOU HAVE QUESTIONS ABOUT YOUR PRE-OP INSTRUCTIONS PLEASE CALL [phone]     [facility] - Preparing for Surgery Before surgery, you can play an important role.  Because skin is not sterile, your skin needs to be as free of germs as possible.  You can reduce the number of germs on your skin by washing with CHG (chlorahexidine gluconate) soap before surgery.  CHG is an antiseptic cleaner which kills germs and bonds with the skin to continue killing germs even after washing. Please DO NOT use if you have an allergy to CHG or antibacterial soaps.  If your skin becomes reddened/irritated stop using the CHG and inform your nurse when you arrive at Short Stay. Do not shave (including legs and underarms) for at least 48 hours prior to the first CHG shower.  You may shave your face/neck. Please follow these instructions carefully:  1.  Shower with CHG Soap  the night before surgery and the  morning of Surgery.  2.  If you choose to wash your hair, wash your hair first as usual with your  normal  shampoo.  3.  After you shampoo, rinse your hair and body thoroughly to remove the  shampoo.                           4.  Use CHG as you would any other liquid soap.  You can apply chg directly  to the skin and wash                       Gently with a scrungie or clean washcloth.  5.  Apply the CHG Soap to your body ONLY FROM THE NECK DOWN.   Do not use on face/ open                           Wound or open sores. Avoid contact with eyes, ears mouth and genitals (private parts).                       Wash face,  Genitals (private parts) with your normal soap.             6.  Wash thoroughly, paying special attention to the area where your surgery  will be performed.  7.  Thoroughly rinse your body with warm water from the neck down.  8.  DO NOT shower/wash with your normal soap after using and rinsing off  the CHG Soap.                9.  Pat yourself dry with a clean towel.            10.  Wear clean pajamas.            11.  Place clean sheets on your bed the night of your first shower and do not  sleep with pets. Day of Surgery : Do not apply any lotions/deodorants the morning of surgery.  Please wear clean clothes to the hospital/surgery center.  FAILURE TO FOLLOW THESE INSTRUCTIONS MAY RESULT IN THE CANCELLATION OF YOUR SURGERY PATIENT SIGNATURE_________________________________  NURSE SIGNATURE__________________________________  ________________________________________________________________________

## 2022-05-28 NOTE — Progress Notes (Signed)
Pt. Needs orders for upcoming surgery. ?

## 2022-05-29 ENCOUNTER — Encounter (HOSPITAL_COMMUNITY): Payer: Self-pay

## 2022-05-29 ENCOUNTER — Other Ambulatory Visit: Payer: Self-pay

## 2022-05-29 ENCOUNTER — Encounter (HOSPITAL_COMMUNITY)
Admission: RE | Admit: 2022-05-29 | Discharge: 2022-05-29 | Disposition: A | Discharge status: Home / Self Care | Payer: BC Managed Care – PPO | Source: Ambulatory Visit | Attending: Surgery | Admitting: Surgery

## 2022-05-29 VITALS — BP 124/83 | HR 78 | Temp 98.0°F | Ht 68.0 in | Wt 197.0 lb

## 2022-05-29 DIAGNOSIS — I10 Essential (primary) hypertension: Secondary | ICD-10-CM | POA: Diagnosis not present

## 2022-05-29 DIAGNOSIS — Z01818 Encounter for other preprocedural examination: Secondary | ICD-10-CM

## 2022-05-29 DIAGNOSIS — Z01812 Encounter for preprocedural laboratory examination: Secondary | ICD-10-CM | POA: Diagnosis present

## 2022-05-29 LAB — BASIC METABOLIC PANEL
Anion gap: 11 (ref 5–15)
BUN: 19 mg/dL (ref 6–20)
CO2: 23 mmol/L (ref 22–32)
Calcium: 9.7 mg/dL (ref 8.9–10.3)
Chloride: 105 mmol/L (ref 98–111)
Creatinine, Ser: 0.85 mg/dL (ref 0.61–1.24)
GFR, Estimated: >60 mL/min (ref >60)
Glucose, Bld: 103 mg/dL — ABNORMAL HIGH (ref 70–99)
Potassium: 4.3 mmol/L (ref 3.5–5.1)
Sodium: 139 mmol/L (ref 135–145)

## 2022-05-29 LAB — CBC
HCT: 39.7 % (ref 39.0–52.0)
Hemoglobin: 13.3 g/dL (ref 13.0–17.0)
MCH: 31.7 pg (ref 26.0–34.0)
MCHC: 33.5 g/dL (ref 30.0–36.0)
MCV: 94.7 fL (ref 80.0–100.0)
Platelets: 224 10*3/uL (ref 150–400)
RBC: 4.19 MIL/uL — ABNORMAL LOW (ref 4.22–5.81)
RDW: 14.9 % (ref 11.5–15.5)
WBC: 4.3 10*3/uL (ref 4.0–10.5)
nRBC: 0 % (ref 0.0–0.2)

## 2022-06-01 ENCOUNTER — Ambulatory Visit: Payer: Self-pay | Admitting: Surgery

## 2022-06-01 DIAGNOSIS — R739 Hyperglycemia, unspecified: Secondary | ICD-10-CM

## 2022-06-04 ENCOUNTER — Other Ambulatory Visit: Payer: Self-pay

## 2022-06-04 ENCOUNTER — Encounter (HOSPITAL_COMMUNITY)
Admission: RE | Disposition: A | Discharge status: Home / Self Care | Payer: Self-pay | Source: Ambulatory Visit | Attending: Surgery

## 2022-06-04 ENCOUNTER — Inpatient Hospital Stay (HOSPITAL_COMMUNITY)
Admission: RE | Admit: 2022-06-04 | Discharge: 2022-06-06 | DRG: 331 | Disposition: A | Discharge status: Home / Self Care | Payer: BC Managed Care – PPO | Source: Ambulatory Visit | Attending: Surgery | Admitting: Surgery

## 2022-06-04 ENCOUNTER — Inpatient Hospital Stay (HOSPITAL_COMMUNITY): Payer: BC Managed Care – PPO | Admitting: Certified Registered Nurse Anesthetist

## 2022-06-04 ENCOUNTER — Encounter (HOSPITAL_COMMUNITY): Payer: Self-pay | Admitting: Surgery

## 2022-06-04 ENCOUNTER — Inpatient Hospital Stay (HOSPITAL_COMMUNITY): Payer: BC Managed Care – PPO | Admitting: Physician Assistant

## 2022-06-04 DIAGNOSIS — Z885 Allergy status to narcotic agent status: Secondary | ICD-10-CM | POA: Diagnosis not present

## 2022-06-04 DIAGNOSIS — I10 Essential (primary) hypertension: Secondary | ICD-10-CM | POA: Diagnosis present

## 2022-06-04 DIAGNOSIS — K4090 Unilateral inguinal hernia, without obstruction or gangrene, not specified as recurrent: Secondary | ICD-10-CM | POA: Diagnosis present

## 2022-06-04 DIAGNOSIS — Z833 Family history of diabetes mellitus: Secondary | ICD-10-CM

## 2022-06-04 DIAGNOSIS — K5720 Diverticulitis of large intestine with perforation and abscess without bleeding: Secondary | ICD-10-CM | POA: Diagnosis present

## 2022-06-04 DIAGNOSIS — Z87891 Personal history of nicotine dependence: Secondary | ICD-10-CM

## 2022-06-04 DIAGNOSIS — R739 Hyperglycemia, unspecified: Secondary | ICD-10-CM

## 2022-06-04 DIAGNOSIS — Z806 Family history of leukemia: Secondary | ICD-10-CM

## 2022-06-04 DIAGNOSIS — Z86010 Personal history of colonic polyps: Secondary | ICD-10-CM | POA: Diagnosis not present

## 2022-06-04 HISTORY — PX: PROCTOSCOPY: SHX2266

## 2022-06-04 HISTORY — PX: OSTOMY: SHX5997

## 2022-06-04 HISTORY — PX: XI ROBOTIC ASSISTED LOWER ANTERIOR RESECTION: SHX6558

## 2022-06-04 LAB — TYPE AND SCREEN
ABO/RH(D): O POS
Antibody Screen: NEGATIVE

## 2022-06-04 LAB — ABO/RH: ABO/RH(D): O POS

## 2022-06-04 LAB — HEMOGLOBIN A1C
Hgb A1c MFr Bld: 5.4 % (ref 4.8–5.6)
Mean Plasma Glucose: 108.28 mg/dL

## 2022-06-04 SURGERY — RESECTION, RECTUM, LOW ANTERIOR, ROBOT-ASSISTED
Anesthesia: General | Site: Bladder

## 2022-06-04 MED ORDER — PROCHLORPERAZINE MALEATE 10 MG PO TABS: 10.0000 mg | ORAL_TABLET | Freq: Four times a day (QID) | ORAL | Status: DC | PRN

## 2022-06-04 MED ORDER — ACETAMINOPHEN 500 MG PO TABS: 1000.0000 mg | ORAL_TABLET | ORAL | Status: AC

## 2022-06-04 MED ORDER — ENSURE PRE-SURGERY PO LIQD
296.0000 mL | Freq: Once | ORAL | Status: DC
  Filled 2022-06-04: qty 296

## 2022-06-04 MED ORDER — FENTANYL CITRATE PF 50 MCG/ML IJ SOSY
25.0000 ug | PREFILLED_SYRINGE | INTRAMUSCULAR | Status: DC | PRN
  Administered 2022-06-04: 50 ug via INTRAVENOUS

## 2022-06-04 MED ORDER — LIP MEDEX EX OINT
TOPICAL_OINTMENT | Freq: Two times a day (BID) | CUTANEOUS | Status: DC
  Administered 2022-06-04: 75 via TOPICAL
  Filled 2022-06-04 (×2): qty 7

## 2022-06-04 MED ORDER — ROCURONIUM BROMIDE 10 MG/ML (PF) SYRINGE
PREFILLED_SYRINGE | INTRAVENOUS | Status: AC
Start: 2022-06-04 — End: ?
  Filled 2022-06-04: qty 10

## 2022-06-04 MED ORDER — CELECOXIB 200 MG PO CAPS: 200.0000 mg | ORAL_CAPSULE | ORAL | Status: AC

## 2022-06-04 MED ORDER — CELECOXIB 200 MG PO CAPS
ORAL_CAPSULE | ORAL | Status: AC
  Administered 2022-06-04: 200 mg via ORAL
  Filled 2022-06-04: qty 1

## 2022-06-04 MED ORDER — METOPROLOL TARTRATE 5 MG/5ML IV SOLN: 5.0000 mg | Freq: Four times a day (QID) | INTRAVENOUS | Status: DC | PRN

## 2022-06-04 MED ORDER — PROPOFOL 10 MG/ML IV BOLUS
INTRAVENOUS | Status: AC
Start: 2022-06-04 — End: ?
  Filled 2022-06-04: qty 20

## 2022-06-04 MED ORDER — SODIUM CHLORIDE 0.9 % IR SOLN
Status: DC | PRN
  Administered 2022-06-04: 1000 mL via INTRAVESICAL

## 2022-06-04 MED ORDER — MIDAZOLAM HCL 2 MG/2ML IJ SOLN
INTRAMUSCULAR | Status: DC | PRN
  Administered 2022-06-04: 2 mg via INTRAVENOUS

## 2022-06-04 MED ORDER — ENSURE PRE-SURGERY PO LIQD
592.0000 mL | Freq: Once | ORAL | Status: DC
  Filled 2022-06-04: qty 592

## 2022-06-04 MED ORDER — LACTATED RINGERS IV SOLN: INTRAVENOUS | Status: DC

## 2022-06-04 MED ORDER — METHOCARBAMOL 500 MG PO TABS
1000.0000 mg | ORAL_TABLET | Freq: Four times a day (QID) | ORAL | Status: DC | PRN
  Administered 2022-06-05: 1000 mg via ORAL
  Administered 2022-06-05: 500 mg via ORAL
  Filled 2022-06-04 (×2): qty 2

## 2022-06-04 MED ORDER — ENOXAPARIN SODIUM 40 MG/0.4ML IJ SOSY
40.0000 mg | PREFILLED_SYRINGE | Freq: Once | INTRAMUSCULAR | Status: AC
  Administered 2022-06-04: 40 mg via SUBCUTANEOUS
  Filled 2022-06-04: qty 0.4

## 2022-06-04 MED ORDER — CALCIUM POLYCARBOPHIL 625 MG PO TABS
625.0000 mg | ORAL_TABLET | Freq: Two times a day (BID) | ORAL | Status: DC
  Administered 2022-06-04 – 2022-06-06 (×4): 625 mg via ORAL
  Filled 2022-06-04 (×4): qty 1

## 2022-06-04 MED ORDER — BISACODYL 5 MG PO TBEC: 20.0000 mg | DELAYED_RELEASE_TABLET | Freq: Once | ORAL | Status: DC

## 2022-06-04 MED ORDER — ONDANSETRON HCL 4 MG PO TABS: 4.0000 mg | ORAL_TABLET | Freq: Four times a day (QID) | ORAL | Status: DC | PRN

## 2022-06-04 MED ORDER — DIPHENHYDRAMINE HCL 50 MG/ML IJ SOLN: 12.5000 mg | Freq: Four times a day (QID) | INTRAMUSCULAR | Status: DC | PRN

## 2022-06-04 MED ORDER — DEXAMETHASONE SODIUM PHOSPHATE 10 MG/ML IJ SOLN
INTRAMUSCULAR | Status: AC
  Filled 2022-06-04: qty 1

## 2022-06-04 MED ORDER — MELATONIN 3 MG PO TABS: 3.0000 mg | ORAL_TABLET | Freq: Every evening | ORAL | Status: DC | PRN

## 2022-06-04 MED ORDER — TRAMADOL HCL 50 MG PO TABS
50.0000 mg | ORAL_TABLET | Freq: Four times a day (QID) | ORAL | Status: DC | PRN
  Administered 2022-06-04 – 2022-06-05 (×4): 100 mg via ORAL
  Filled 2022-06-04 (×4): qty 2

## 2022-06-04 MED ORDER — ADULT MULTIVITAMIN W/MINERALS CH
1.0000 | ORAL_TABLET | Freq: Every day | ORAL | Status: DC
  Administered 2022-06-04 – 2022-06-06 (×3): 1 via ORAL
  Filled 2022-06-04 (×3): qty 1

## 2022-06-04 MED ORDER — ACETAMINOPHEN 500 MG PO TABS
ORAL_TABLET | ORAL | Status: AC
  Administered 2022-06-04: 1000 mg via ORAL
  Filled 2022-06-04: qty 2

## 2022-06-04 MED ORDER — POLYETHYLENE GLYCOL 3350 17 GM/SCOOP PO POWD
1.0000 | Freq: Once | ORAL | Status: DC
  Filled 2022-06-04: qty 255

## 2022-06-04 MED ORDER — PROCHLORPERAZINE EDISYLATE 10 MG/2ML IJ SOLN: 5.0000 mg | Freq: Four times a day (QID) | INTRAMUSCULAR | Status: DC | PRN

## 2022-06-04 MED ORDER — BUPIVACAINE LIPOSOME 1.3 % IJ SUSP: 20.0000 mL | Freq: Once | INTRAMUSCULAR | Status: DC

## 2022-06-04 MED ORDER — STERILE WATER FOR INJECTION IJ SOLN
INTRAMUSCULAR | Status: DC | PRN
  Administered 2022-06-04: 15 mL via INTRAMUSCULAR

## 2022-06-04 MED ORDER — ACETAMINOPHEN 500 MG PO TABS
1000.0000 mg | ORAL_TABLET | Freq: Four times a day (QID) | ORAL | Status: DC
  Administered 2022-06-04 – 2022-06-06 (×6): 1000 mg via ORAL
  Filled 2022-06-04 (×6): qty 2

## 2022-06-04 MED ORDER — LIDOCAINE 2% (20 MG/ML) 5 ML SYRINGE
INTRAMUSCULAR | Status: DC | PRN
  Administered 2022-06-04: 1.5 mg/kg/h via INTRAVENOUS
  Administered 2022-06-04: 60 mg via INTRAVENOUS

## 2022-06-04 MED ORDER — METHOCARBAMOL 1000 MG/10ML IJ SOLN
1000.0000 mg | Freq: Four times a day (QID) | INTRAVENOUS | Status: DC | PRN
  Filled 2022-06-04: qty 10

## 2022-06-04 MED ORDER — KETAMINE HCL 10 MG/ML IJ SOLN
INTRAMUSCULAR | Status: DC | PRN
  Administered 2022-06-04: 40 mg via INTRAVENOUS
  Administered 2022-06-04: 10 mg via INTRAVENOUS

## 2022-06-04 MED ORDER — ENSURE SURGERY PO LIQD
237.0000 mL | Freq: Two times a day (BID) | ORAL | Status: DC
  Administered 2022-06-05 (×2): 237 mL via ORAL

## 2022-06-04 MED ORDER — KETAMINE HCL 50 MG/5ML IJ SOSY
PREFILLED_SYRINGE | INTRAMUSCULAR | Status: AC
  Filled 2022-06-04: qty 5

## 2022-06-04 MED ORDER — ORAL CARE MOUTH RINSE: 15.0000 mL | Freq: Once | OROMUCOSAL | Status: AC

## 2022-06-04 MED ORDER — MAGIC MOUTHWASH
15.0000 mL | Freq: Four times a day (QID) | ORAL | Status: DC | PRN
  Filled 2022-06-04: qty 15

## 2022-06-04 MED ORDER — HYDROMORPHONE HCL 1 MG/ML IJ SOLN: 0.5000 mg | INTRAMUSCULAR | Status: DC | PRN

## 2022-06-04 MED ORDER — ROCURONIUM BROMIDE 10 MG/ML (PF) SYRINGE
PREFILLED_SYRINGE | INTRAVENOUS | Status: AC
  Filled 2022-06-04: qty 10

## 2022-06-04 MED ORDER — ALVIMOPAN 12 MG PO CAPS: 12.0000 mg | ORAL_CAPSULE | ORAL | Status: AC

## 2022-06-04 MED ORDER — STERILE WATER FOR INJECTION IJ SOLN
INTRAMUSCULAR | Status: AC
  Filled 2022-06-04: qty 10

## 2022-06-04 MED ORDER — GABAPENTIN 300 MG PO CAPS: 300.0000 mg | ORAL_CAPSULE | ORAL | Status: AC

## 2022-06-04 MED ORDER — EPHEDRINE SULFATE-NACL 50-0.9 MG/10ML-% IV SOSY
PREFILLED_SYRINGE | INTRAVENOUS | Status: DC | PRN
  Administered 2022-06-04: 5 mg via INTRAVENOUS

## 2022-06-04 MED ORDER — LIDOCAINE HCL (PF) 2 % IJ SOLN
INTRAMUSCULAR | Status: AC
  Filled 2022-06-04: qty 5

## 2022-06-04 MED ORDER — SODIUM CHLORIDE 0.9 % IV SOLN
2.0000 g | INTRAVENOUS | Status: AC
  Administered 2022-06-04: 2 g via INTRAVENOUS
  Filled 2022-06-04: qty 2

## 2022-06-04 MED ORDER — ALVIMOPAN 12 MG PO CAPS
ORAL_CAPSULE | ORAL | Status: AC
  Administered 2022-06-04: 12 mg via ORAL
  Filled 2022-06-04: qty 1

## 2022-06-04 MED ORDER — SODIUM CHLORIDE 0.9 % IV SOLN
2.0000 g | Freq: Two times a day (BID) | INTRAVENOUS | Status: AC
  Administered 2022-06-04: 2 g via INTRAVENOUS
  Filled 2022-06-04: qty 2

## 2022-06-04 MED ORDER — PHENYLEPHRINE HCL-NACL 20-0.9 MG/250ML-% IV SOLN
INTRAVENOUS | Status: DC | PRN
  Administered 2022-06-04: 40 ug/min via INTRAVENOUS

## 2022-06-04 MED ORDER — ONDANSETRON HCL 4 MG/2ML IJ SOLN
INTRAMUSCULAR | Status: AC
  Filled 2022-06-04: qty 2

## 2022-06-04 MED ORDER — ROCURONIUM BROMIDE 10 MG/ML (PF) SYRINGE
PREFILLED_SYRINGE | INTRAVENOUS | Status: DC | PRN
  Administered 2022-06-04: 30 mg via INTRAVENOUS
  Administered 2022-06-04: 100 mg via INTRAVENOUS
  Administered 2022-06-04: 10 mg via INTRAVENOUS

## 2022-06-04 MED ORDER — MIDAZOLAM HCL 2 MG/2ML IJ SOLN
INTRAMUSCULAR | Status: AC
  Filled 2022-06-04: qty 2

## 2022-06-04 MED ORDER — BUPIVACAINE LIPOSOME 1.3 % IJ SUSP
INTRAMUSCULAR | Status: AC
  Filled 2022-06-04: qty 20

## 2022-06-04 MED ORDER — AMLODIPINE BESYLATE 5 MG PO TABS
5.0000 mg | ORAL_TABLET | Freq: Every day | ORAL | Status: DC
  Administered 2022-06-04 – 2022-06-06 (×3): 5 mg via ORAL
  Filled 2022-06-04 (×3): qty 1

## 2022-06-04 MED ORDER — LIDOCAINE HCL 2 % IJ SOLN
INTRAMUSCULAR | Status: AC
  Filled 2022-06-04: qty 20

## 2022-06-04 MED ORDER — SODIUM CHLORIDE 0.9 % IV SOLN: Freq: Three times a day (TID) | INTRAVENOUS | Status: DC | PRN

## 2022-06-04 MED ORDER — ONDANSETRON HCL 4 MG/2ML IJ SOLN: 4.0000 mg | Freq: Four times a day (QID) | INTRAMUSCULAR | Status: DC | PRN

## 2022-06-04 MED ORDER — ONDANSETRON HCL 4 MG/2ML IJ SOLN
INTRAMUSCULAR | Status: DC | PRN
  Administered 2022-06-04: 4 mg via INTRAVENOUS

## 2022-06-04 MED ORDER — PHENYLEPHRINE 80 MCG/ML (10ML) SYRINGE FOR IV PUSH (FOR BLOOD PRESSURE SUPPORT)
PREFILLED_SYRINGE | INTRAVENOUS | Status: AC
  Filled 2022-06-04: qty 10

## 2022-06-04 MED ORDER — BUPIVACAINE LIPOSOME 1.3 % IJ SUSP
INTRAMUSCULAR | Status: DC | PRN
  Administered 2022-06-04: 20 mL

## 2022-06-04 MED ORDER — SUGAMMADEX SODIUM 200 MG/2ML IV SOLN
INTRAVENOUS | Status: DC | PRN
  Administered 2022-06-04: 200 mg via INTRAVENOUS

## 2022-06-04 MED ORDER — ALVIMOPAN 12 MG PO CAPS
12.0000 mg | ORAL_CAPSULE | Freq: Two times a day (BID) | ORAL | Status: DC
  Filled 2022-06-04 (×2): qty 1

## 2022-06-04 MED ORDER — 0.9 % SODIUM CHLORIDE (POUR BTL) OPTIME
TOPICAL | Status: DC | PRN
  Administered 2022-06-04: 2000 mL

## 2022-06-04 MED ORDER — ENOXAPARIN SODIUM 40 MG/0.4ML IJ SOSY
40.0000 mg | PREFILLED_SYRINGE | INTRAMUSCULAR | Status: DC
  Administered 2022-06-05 – 2022-06-06 (×2): 40 mg via SUBCUTANEOUS
  Filled 2022-06-04 (×2): qty 0.4

## 2022-06-04 MED ORDER — DIPHENHYDRAMINE HCL 12.5 MG/5ML PO ELIX: 12.5000 mg | ORAL_SOLUTION | Freq: Four times a day (QID) | ORAL | Status: DC | PRN

## 2022-06-04 MED ORDER — SIMETHICONE 80 MG PO CHEW: 40.0000 mg | CHEWABLE_TABLET | Freq: Four times a day (QID) | ORAL | Status: DC | PRN

## 2022-06-04 MED ORDER — BUPIVACAINE-EPINEPHRINE (PF) 0.25% -1:200000 IJ SOLN
INTRAMUSCULAR | Status: AC
  Filled 2022-06-04: qty 60

## 2022-06-04 MED ORDER — FENTANYL CITRATE PF 50 MCG/ML IJ SOSY
PREFILLED_SYRINGE | INTRAMUSCULAR | Status: AC
  Filled 2022-06-04: qty 2

## 2022-06-04 MED ORDER — FENTANYL CITRATE (PF) 250 MCG/5ML IJ SOLN
INTRAMUSCULAR | Status: DC | PRN
  Administered 2022-06-04 (×2): 50 ug via INTRAVENOUS
  Administered 2022-06-04: 100 ug via INTRAVENOUS
  Administered 2022-06-04: 50 ug via INTRAVENOUS

## 2022-06-04 MED ORDER — ALUM & MAG HYDROXIDE-SIMETH 200-200-20 MG/5ML PO SUSP: 30.0000 mL | Freq: Four times a day (QID) | ORAL | Status: DC | PRN

## 2022-06-04 MED ORDER — FENTANYL CITRATE (PF) 250 MCG/5ML IJ SOLN
INTRAMUSCULAR | Status: AC
  Filled 2022-06-04: qty 5

## 2022-06-04 MED ORDER — NEOMYCIN SULFATE 500 MG PO TABS: 1000.0000 mg | ORAL_TABLET | ORAL | Status: DC

## 2022-06-04 MED ORDER — HYDRALAZINE HCL 20 MG/ML IJ SOLN: 10.0000 mg | INTRAMUSCULAR | Status: DC | PRN

## 2022-06-04 MED ORDER — LACTATED RINGERS IR SOLN
Status: DC | PRN
  Administered 2022-06-04: 1000 mL

## 2022-06-04 MED ORDER — BUPIVACAINE-EPINEPHRINE (PF) 0.25% -1:200000 IJ SOLN
INTRAMUSCULAR | Status: DC | PRN
  Administered 2022-06-04: 60 mL

## 2022-06-04 MED ORDER — METRONIDAZOLE 500 MG PO TABS: 1000.0000 mg | ORAL_TABLET | ORAL | Status: DC

## 2022-06-04 MED ORDER — PROPOFOL 10 MG/ML IV BOLUS
INTRAVENOUS | Status: DC | PRN
  Administered 2022-06-04: 150 mg via INTRAVENOUS

## 2022-06-04 MED ORDER — DEXAMETHASONE SODIUM PHOSPHATE 10 MG/ML IJ SOLN
INTRAMUSCULAR | Status: DC | PRN
  Administered 2022-06-04: 10 mg via INTRAVENOUS

## 2022-06-04 MED ORDER — EPHEDRINE 5 MG/ML INJ
INTRAVENOUS | Status: AC
  Filled 2022-06-04: qty 5

## 2022-06-04 MED ORDER — GABAPENTIN 300 MG PO CAPS
ORAL_CAPSULE | ORAL | Status: AC
  Administered 2022-06-04: 300 mg via ORAL
  Filled 2022-06-04: qty 1

## 2022-06-04 MED ORDER — LACTATED RINGERS IV BOLUS: 1000.0000 mL | Freq: Three times a day (TID) | INTRAVENOUS | Status: DC | PRN

## 2022-06-04 MED ORDER — ACETAMINOPHEN 500 MG PO TABS: 1000.0000 mg | ORAL_TABLET | Freq: Once | ORAL | Status: DC

## 2022-06-04 MED ORDER — TRAMADOL HCL 50 MG PO TABS: 50.0000 mg | ORAL_TABLET | Freq: Four times a day (QID) | ORAL | 0 refills | Status: AC | PRN

## 2022-06-04 MED ORDER — CHLORHEXIDINE GLUCONATE 0.12 % MT SOLN
15.0000 mL | Freq: Once | OROMUCOSAL | Status: AC
  Administered 2022-06-04: 15 mL via OROMUCOSAL

## 2022-06-04 MED ORDER — LACTATED RINGERS IV SOLN: INTRAVENOUS | Status: DC | PRN

## 2022-06-04 MED ORDER — PHENYLEPHRINE 80 MCG/ML (10ML) SYRINGE FOR IV PUSH (FOR BLOOD PRESSURE SUPPORT)
PREFILLED_SYRINGE | INTRAVENOUS | Status: DC | PRN
  Administered 2022-06-04 (×7): 160 ug via INTRAVENOUS

## 2022-06-04 SURGICAL SUPPLY — 124 items
APPLIER CLIP 5 13 M/L LIGAMAX5 (MISCELLANEOUS)
APPLIER CLIP ROT 10 11.4 M/L (STAPLE)
BAG COUNTER SPONGE SURGICOUNT (BAG) ×4 IMPLANT
BAG URO CATCHER STRL LF (MISCELLANEOUS) ×4 IMPLANT
BLADE EXTENDED COATED 6.5IN (ELECTRODE) IMPLANT
CANNULA REDUC XI 12-8 STAPL (CANNULA) ×1
CANNULA REDUCER 12-8 DVNC XI (CANNULA) IMPLANT
CATH URETL OPEN END 6FR 70 (CATHETERS) IMPLANT
CELLS DAT CNTRL 66122 CELL SVR (MISCELLANEOUS) IMPLANT
CHLORAPREP W/TINT 26 (MISCELLANEOUS) IMPLANT
CLIP APPLIE 5 13 M/L LIGAMAX5 (MISCELLANEOUS) IMPLANT
CLIP APPLIE ROT 10 11.4 M/L (STAPLE) IMPLANT
CLOTH BEACON ORANGE TIMEOUT ST (SAFETY) ×3 IMPLANT
COVER SURGICAL LIGHT HANDLE (MISCELLANEOUS) ×8 IMPLANT
COVER TIP SHEARS 8 DVNC (MISCELLANEOUS) ×3 IMPLANT
COVER TIP SHEARS 8MM DA VINCI (MISCELLANEOUS) ×1
DEVICE TROCAR PUNCTURE CLOSURE (ENDOMECHANICALS) IMPLANT
DRAIN CHANNEL 19F RND (DRAIN) IMPLANT
DRAPE ARM DVNC X/XI (DISPOSABLE) ×12 IMPLANT
DRAPE COLUMN DVNC XI (DISPOSABLE) ×3 IMPLANT
DRAPE DA VINCI XI ARM (DISPOSABLE) ×4
DRAPE DA VINCI XI COLUMN (DISPOSABLE) ×1
DRAPE SURG IRRIG POUCH 19X23 (DRAPES) ×4 IMPLANT
DRSG OPSITE POSTOP 4X10 (GAUZE/BANDAGES/DRESSINGS) IMPLANT
DRSG OPSITE POSTOP 4X6 (GAUZE/BANDAGES/DRESSINGS) ×1 IMPLANT
DRSG OPSITE POSTOP 4X8 (GAUZE/BANDAGES/DRESSINGS) IMPLANT
DRSG TEGADERM 2-3/8X2-3/4 SM (GAUZE/BANDAGES/DRESSINGS) ×20 IMPLANT
DRSG TEGADERM 4X4.75 (GAUZE/BANDAGES/DRESSINGS) IMPLANT
ELECT PENCIL ROCKER SW 15FT (MISCELLANEOUS) ×4 IMPLANT
ELECT REM PT RETURN 15FT ADLT (MISCELLANEOUS) ×4 IMPLANT
ENDOLOOP SUT PDS II  0 18 (SUTURE)
ENDOLOOP SUT PDS II 0 18 (SUTURE) IMPLANT
EVACUATOR SILICONE 100CC (DRAIN) IMPLANT
GAUZE SPONGE 2X2 8PLY STRL LF (GAUZE/BANDAGES/DRESSINGS) ×3 IMPLANT
GLOVE ECLIPSE 8.0 STRL XLNG CF (GLOVE) ×12 IMPLANT
GLOVE INDICATOR 8.0 STRL GRN (GLOVE) ×12 IMPLANT
GLOVE SURG LX 7.5 STRW (GLOVE) ×1
GLOVE SURG LX STRL 7.5 STRW (GLOVE) ×3 IMPLANT
GOWN SRG XL LVL 4 BRTHBL STRL (GOWNS) ×3 IMPLANT
GOWN STRL NON-REIN XL LVL4 (GOWNS) ×1
GOWN STRL REUS W/ TWL XL LVL3 (GOWN DISPOSABLE) ×15 IMPLANT
GOWN STRL REUS W/TWL XL LVL3 (GOWN DISPOSABLE) ×5
GRASPER SUT TROCAR 14GX15 (MISCELLANEOUS) IMPLANT
GUIDEWIRE STR DUAL SENSOR (WIRE) ×4 IMPLANT
HOLDER FOLEY CATH W/STRAP (MISCELLANEOUS) ×4 IMPLANT
IRRIG SUCT STRYKERFLOW 2 WTIP (MISCELLANEOUS) ×4
IRRIGATION SUCT STRKRFLW 2 WTP (MISCELLANEOUS) ×3 IMPLANT
KIT PROCEDURE DA VINCI SI (MISCELLANEOUS)
KIT PROCEDURE DVNC SI (MISCELLANEOUS) IMPLANT
KIT SIGMOIDOSCOPE (SET/KITS/TRAYS/PACK) IMPLANT
KIT TURNOVER KIT A (KITS) IMPLANT
MANIFOLD NEPTUNE II (INSTRUMENTS) ×4 IMPLANT
NDL INSUFFLATION 14GA 120MM (NEEDLE) ×3 IMPLANT
NEEDLE INSUFFLATION 14GA 120MM (NEEDLE) ×4 IMPLANT
NS IRRIG 1000ML POUR BTL (IV SOLUTION) IMPLANT
PACK CARDIOVASCULAR III (CUSTOM PROCEDURE TRAY) ×4 IMPLANT
PACK COLON (CUSTOM PROCEDURE TRAY) ×4 IMPLANT
PACK CYSTO (CUSTOM PROCEDURE TRAY) ×4 IMPLANT
PAD POSITIONING PINK XL (MISCELLANEOUS) ×4 IMPLANT
PENCIL SMOKE EVACUATOR (MISCELLANEOUS) IMPLANT
PROTECTOR NERVE ULNAR (MISCELLANEOUS) ×8 IMPLANT
RELOAD STAPLE 45 3.5 BLU DVNC (STAPLE) IMPLANT
RELOAD STAPLE 45 4.3 GRN DVNC (STAPLE) IMPLANT
RELOAD STAPLE 60 3.5 BLU DVNC (STAPLE) IMPLANT
RELOAD STAPLE 60 4.3 GRN DVNC (STAPLE) IMPLANT
RELOAD STAPLER 3.5X45 BLU DVNC (STAPLE) IMPLANT
RELOAD STAPLER 3.5X60 BLU DVNC (STAPLE) ×3 IMPLANT
RELOAD STAPLER 4.3X45 GRN DVNC (STAPLE) IMPLANT
RELOAD STAPLER 4.3X60 GRN DVNC (STAPLE) IMPLANT
RETRACTOR WND ALEXIS 18 MED (MISCELLANEOUS) IMPLANT
RTRCTR WOUND ALEXIS 18CM MED (MISCELLANEOUS)
SCISSORS LAP 5X35 DISP (ENDOMECHANICALS) ×4 IMPLANT
SEAL CANN UNIV 5-8 DVNC XI (MISCELLANEOUS) ×9 IMPLANT
SEAL XI 5MM-8MM UNIVERSAL (MISCELLANEOUS) ×3
SEALER VESSEL DA VINCI XI (MISCELLANEOUS) ×1
SEALER VESSEL EXT DVNC XI (MISCELLANEOUS) ×3 IMPLANT
SOLUTION ELECTROLUBE (MISCELLANEOUS) ×4 IMPLANT
SPIKE FLUID TRANSFER (MISCELLANEOUS) ×4 IMPLANT
SPONGE GAUZE 2X2 STER 10/PKG (GAUZE/BANDAGES/DRESSINGS) ×1
STAPLER 45 DA VINCI SURE FORM (STAPLE)
STAPLER 45 SUREFORM DVNC (STAPLE) IMPLANT
STAPLER 60 DA VINCI SURE FORM (STAPLE) ×1
STAPLER 60 SUREFORM DVNC (STAPLE) IMPLANT
STAPLER CANNULA SEAL DVNC XI (STAPLE) ×3 IMPLANT
STAPLER CANNULA SEAL XI (STAPLE) ×1
STAPLER ECHELON POWER CIR 29 (STAPLE) IMPLANT
STAPLER ECHELON POWER CIR 31 (STAPLE) IMPLANT
STAPLER RELOAD 3.5X45 BLU DVNC (STAPLE)
STAPLER RELOAD 3.5X45 BLUE (STAPLE)
STAPLER RELOAD 3.5X60 BLU DVNC (STAPLE) ×3
STAPLER RELOAD 3.5X60 BLUE (STAPLE) ×1
STAPLER RELOAD 4.3X45 GREEN (STAPLE)
STAPLER RELOAD 4.3X45 GRN DVNC (STAPLE)
STAPLER RELOAD 4.3X60 GREEN (STAPLE)
STAPLER RELOAD 4.3X60 GRN DVNC (STAPLE)
STOPCOCK 4 WAY LG BORE MALE ST (IV SETS) ×8 IMPLANT
SURGILUBE 2OZ TUBE FLIPTOP (MISCELLANEOUS) IMPLANT
SUT MNCRL AB 4-0 PS2 18 (SUTURE) ×4 IMPLANT
SUT PDS AB 1 CT1 27 (SUTURE) ×8 IMPLANT
SUT PROLENE 0 CT 2 (SUTURE) IMPLANT
SUT PROLENE 2 0 KS (SUTURE) IMPLANT
SUT PROLENE 2 0 SH DA (SUTURE) IMPLANT
SUT SILK 2 0 (SUTURE)
SUT SILK 2 0 SH CR/8 (SUTURE) IMPLANT
SUT SILK 2-0 18XBRD TIE 12 (SUTURE) IMPLANT
SUT SILK 3 0 (SUTURE)
SUT SILK 3 0 SH CR/8 (SUTURE) ×4 IMPLANT
SUT SILK 3-0 18XBRD TIE 12 (SUTURE) IMPLANT
SUT V-LOC BARB 180 2/0GR6 GS22 (SUTURE)
SUT VIC AB 3-0 SH 18 (SUTURE) IMPLANT
SUT VIC AB 3-0 SH 27 (SUTURE)
SUT VIC AB 3-0 SH 27XBRD (SUTURE) IMPLANT
SUT VICRYL 0 UR6 27IN ABS (SUTURE) ×4 IMPLANT
SUTURE V-LC BRB 180 2/0GR6GS22 (SUTURE) IMPLANT
SYR 10ML ECCENTRIC (SYRINGE) ×4 IMPLANT
SYS LAPSCP GELPORT 120MM (MISCELLANEOUS)
SYS WOUND ALEXIS 18CM MED (MISCELLANEOUS) ×4
SYSTEM LAPSCP GELPORT 120MM (MISCELLANEOUS) IMPLANT
SYSTEM WOUND ALEXIS 18CM MED (MISCELLANEOUS) ×3 IMPLANT
TOWEL OR NON WOVEN STRL DISP B (DISPOSABLE) ×4 IMPLANT
TRAY FOLEY MTR SLVR 16FR STAT (SET/KITS/TRAYS/PACK) ×4 IMPLANT
TROCAR ADV FIXATION 5X100MM (TROCAR) ×4 IMPLANT
TUBING CONNECTING 10 (TUBING) ×12 IMPLANT
TUBING INSUFFLATION 10FT LAP (TUBING) ×4 IMPLANT

## 2022-06-04 NOTE — Op Note (Signed)
Preoperative diagnosis: Diverticulitis  Postoperative diagnosis: Diverticulitis  Procedure: Cystoscopy with retrograde ureteral injection of indocyanine green dye  Surgeon: Moody Bruins MD  Anesthesia: General  Complications: None  Indication: Mr. Bingham is a 61 year old gentleman with diverticular disease scheduled to undergo minimally invasive surgical intervention today.  Per general surgery, was recommended to have indocyanine green dye injected into his ureters in a retrograde fashion for identification purposes during his procedure.  The potential risks, complications, and expected recovery process was discussed in detail.  Informed consent was obtained.  Description of procedure: The patient was taken to the operating room and a general anesthetic was administered.  He was placed in the dorsolithotomy position, prepped and draped in usual sterile fashion, and administered preoperative antibiotics.  A preoperative timeout was performed.  Cystourethroscopy was performed.  There was a normal urethra.  Inspection of the bladder revealed no bladder tumors or other mucosal abnormalities.  Attention then turned to the right ureteral orifice.  The orifice was too small to allow passage of the 6 French ureteral catheter.  Therefore, a 0.38 sensor guidewire was used to navigate the ureteral catheter into the distal ureter.  10 cc of indocyanine green dye was then injected.  An identical procedure was then performed on the contralateral side again using the wire to navigate the catheter into the left ureter.  The patient tolerated the procedure well and without complications.  A Foley catheter was inserted into the bladder and the procedure was turned over to Dr. Estelle Grumbles for the remainder of the procedure.

## 2022-06-04 NOTE — H&P (Signed)
06/04/2022   REFERRING PHYSICIAN: Self  Patient Care Team: Lanelle Bal as PCP - General (Family Medicine) Johney Maine, Adrian Saran, MD as Consulting Provider (General Surgery) Magod, Glade Stanford, MD (Gastroenterology)  PROVIDER: Hollace Kinnier, MD  [facility] MRN: [id number] DOB: 28-Jul-1961  SUBJECTIVE   Chief Complaint: Post Operative Visit (Perforated diverticulitis with abscess and drain placement)   History of Present Illness: Jim Moran is a 61 y.o. male who is seen today  as an office consultation at the request of Dr. Pasty Arch  for evaluation of Post Operative Visit (Perforated diverticulitis with abscess and drain placement) .   Patient returns. He had been admitted 4/17 at [facility] with pain in the left lower quadrant and found to have diverticulitis. Had persistent pain with partial symptom relief. Follow-up CAT scan noted an abscess. Drain placed. Plan was to discharge with outpatient drain study and surgical visit to discuss interval colonoscopy and probable sigmoid colectomy 6 weeks later. Over that point infection/inflammation to be down enough and a 1 stage minimally invasive robotic resection could happen. Therefore, avoiding colostomy.  Patient felt worsening right groin pain and came back to the hospital in less than a week. CT scan revealed a hernia that contained a fluid collection suspicious for abscess. The plan was for him to admitted and cooldown antibiotics. Initial impression was not amenable to percutaneous drainage. Patient felt better and left AMA. Then the wife called wondering what to do. Recommended continuing Augmentin and Flagyl antibiotics for 2 weeks with follow-up drain study and follow-up with [place] to continue the plan. If not better, may need to be readmitted with [facility] resection.  Apparently the patient felt better and completed antibiotics. He had a follow-up drain study 5/10 which showed resolution of abscess with no fistula. Drain  removed. He is tentatively scheduled for colonoscopy by [facility] gastroenterology and then surgery in the next day 06/04/2022.  Patient comes in with his wife. His wife has a good friend that I did colectomy on a few years ago. Patient is healing much better. They saw [facility] gastrology. They recommended going back on Augmentin for another week. He is about to finish that. He feels fine. Tolerating solid food. He is avoided raw fruits and veggies. Appetite and energy level improved. Did lose about 20 pounds but feels in better shape overall. Continues to abstain from smoke. Has a job with rather intense activity so is limited given the pain from his right inguinal hernia. That is annoying but not debilitating at this time.  Medical History:  Past Medical History:  Diagnosis Date   Hypertension   Patient Active Problem List  Diagnosis   Perforation of sigmoid colon due to diverticulitis   AKI (acute kidney injury) (CMS-HCC)   Blood in stool   SIRS (systemic inflammatory response syndrome) (CMS-HCC)   Diverticulitis of large intestine with perforation and abscess without bleeding   Hand tingling   Hematochezia   History of adenomatous polyp of colon   Hypertension, essential   Leukocytosis   Inguinal hernia of right side with obstruction and without gangrene   Screening for intestinal cancer   Skin sensation disturbance   Diverticulitis   History reviewed. No pertinent surgical history.   Allergies  Allergen Reactions   Morphine Hallucination and Other (See Comments)  Hallucinations Hallucinations   Current Outpatient Medications on File Prior to Visit  Medication Sig Dispense Refill   lisinopriL (ZESTRIL) 20 MG tablet 1 tablet   No current facility-administered medications on file  prior to visit.   History reviewed. No pertinent family history.   Social History   Tobacco Use  Smoking Status Former   Types: Cigarettes  Smokeless Tobacco Never    Social History    Socioeconomic History   Marital status: Married  Tobacco Use   Smoking status: Former  Types: Cigarettes   Smokeless tobacco: Never  Substance and Sexual Activity   Alcohol use: Yes   Drug use: Never   ############################################################  Review of Systems: A complete review of systems (ROS) was obtained from the patient. I have reviewed this information and discussed as appropriate with the patient. See HPI as well for other pertinent ROS.  Constitutional: No fevers, chills, sweats. Weight stable Eyes: No vision changes, No discharge HENT: No sore throats, nasal drainage Lymph: No neck swelling, No bruising easily Pulmonary: No cough, productive sputum CV: No orthopnea, PND . No exertional chest/neck/shoulder/arm pain. Patient can walk 30 minutes without difficulty.   GI: No personal nor family history of GI/colon cancer, inflammatory bowel disease, irritable bowel syndrome, allergy such as Celiac Sprue, dietary/dairy problems, colitis, ulcers nor gastritis. No recent sick contacts/gastroenteritis. No travel outside the country. No changes in diet.  Renal: No UTIs, No hematuria Genital: No drainage, bleeding, masses Musculoskeletal: No severe joint pain. Good ROM major joints Skin: No sores or lesions Heme/Lymph: No easy bleeding. No swollen lymph nodes Neuro: No active seizures. No facial droop Psych: No hallucinations. No agitation  OBJECTIVE   There were no vitals filed for this visit.  There is no height or weight on file to calculate BMI.  PHYSICAL EXAM:  Constitutional: Not cachectic. Hygeine adequate. Vitals signs as above.  Eyes: No glasses. Vision adequate,Pupils reactive, normal extraocular movements. Sclera nonicteric Neuro: CN II-XII intact. No major focal sensory defects. No major motor deficits. Lymph: No head/neck/groin lymphadenopathy Psych: No severe agitation. No severe anxiety. Judgment & insight Adequate, Oriented x4, HENT:  Normocephalic, Mucus membranes moist. No thrush. Hearing: adequate Neck: Supple, No tracheal deviation. No obvious thyromegaly Chest: No pain to chest wall compression. Good respiratory excursion. No audible wheezing CV: Pulses intact. regular. No major extremity edema Ext: No obvious deformity or contracture. Edema: Not present. No cyanosis Skin: No major subcutaneous nodules. Warm and dry Musculoskeletal: Severe joint rigidity not present. No obvious clubbing. No digital petechiae. Mobility: no assist device moving easily without restrictions  Abdomen: Flat Soft. Nondistended. Nontender. Hernia: Not present. Diastasis recti: Not present. No hepatomegaly. No splenomegaly.  Genital/Pelvic: Inguinal hernia: Present at: right side - sensitive but soft & reducuble.. Inguinal lymph nodes: without lymphadenopathy nor hidradenitis.   Rectal: (Deferred)    ###################################################################  Labs, Imaging and Diagnostic Testing:  Located in 'Care Everywhere' section of Epic EMR chart  PRIOR [facility] CLINIC NOTES:  Located in 'Care Everywhere' section of Epic EMR chart  SURGERY NOTES:  Not applicable  PATHOLOGY:  Located in 'Care Everywhere' section of Epic EMR chart  Assessment and Plan:  DIAGNOSES:  Diagnoses and all orders for this visit:  Diverticulitis  Perforation of sigmoid colon due to diverticulitis  Inguinal hernia of right side without obstruction and without gangrene  Other orders - neomycin 500 mg tablet; Take 2 tablets (1,000 mg total) by mouth 3 (three) times daily for 3 doses SEE BOWEL PREP INSTRUCTIONS: Take 2 tablets at 2pm, 3pm, and 10pm the day prior to your colon operation. - metroNIDAZOLE (FLAGYL) 500 MG tablet; Take 2 tablets (1,000 mg total) by mouth 3 (three) times daily for 3 doses SEE  BOWEL PREP INSTRUCTIONS: Take 2 tablets at 2pm, 3pm, and 10pm the day prior to your colon operation.    ASSESSMENT/PLAN  Recovering  status hospitalization for diverticulitis with development of abscess and drainage.  Drain is out.  Gastrology want him on antibiotics a bit longer. He is about to complete that.  Continue plan of interval colonoscopy and sigmoid colectomy 6 weeks after hospitalization. Set up for mid June. Hopefully with the infection under better control abscess gone, more likely I can do a minimally base approach with immediate anastomosis that will heal properly and avoid 2-3 stage surgeries requiring temporary ostomy.  The anatomy & physiology of the digestive tract was discussed. The pathophysiology of the colon was discussed. Natural history risks without surgery was discussed. I feel the risks of no intervention will lead to serious problems that outweigh the operative risks; therefore, I recommended a partial colectomy to remove the pathology. Minimally invasive (Robotic/Laparoscopic) & open techniques were discussed.   Risks such as bleeding, infection, abscess, leak, reoperation, injury to other organs, need for repair of tissues / organs, possible ostomy, hernia, heart attack, stroke, death, and other risks were discussed. I noted a good likelihood this will help address the problem. Goals of post-operative recovery were discussed as well. Need for adequate nutrition, daily bowel regimen and healthy physical activity, to optimize recovery was noted as well. We will work to minimize complications. Educational materials were available as well. Questions were answered. The patient expresses understanding & wishes to proceed with surgery.  If has recurrent symptoms, go back on Augmentin and take that perhaps indefinitely until he can get through surgery.  Once he is gone through his robotic colectomy, then schedule laparoscopic hernia repair of the right and possible left groins. That would allow better long-term repair with mesh. Not safe to do hernia surgery at the same time as colectomy.  The anatomy &  physiology of the abdominal wall and pelvic floor was discussed. The pathophysiology of hernias in the inguinal and pelvic region was discussed. Natural history risks such as progressive enlargement, pain, incarceration, and strangulation was discussed. Contributors to complications such as smoking, obesity, diabetes, prior surgery, etc were discussed.   I feel the risks of no intervention will lead to serious problems that outweigh the operative risks; therefore, I recommended surgery to reduce and repair the hernia. I explained laparoscopic techniques with possible need for an open approach. I noted usual use of mesh to patch and/or buttress hernia repair  Risks such as bleeding, infection, abscess, need for further treatment, injury to other organs, need for repair of tissues / organs, stroke, heart attack, death, and other risks were discussed. I noted a good likelihood this will help address the problem. Goals of post-operative recovery were discussed as well. Possibility that this will not correct all symptoms was explained. I stressed the importance of low-impact activity, aggressive pain control, avoiding constipation, & not pushing through pain to minimize risk of post-operative chronic pain or injury. Possibility of reherniation was discussed. We will work to minimize complications.   An educational handout further explaining the pathology & treatment options was given as well. Questions were answered. The patient expresses understanding & wishes to proceed with surgery.  Ardeth Sportsman, MD, FACS, MASCRS Esophageal, Gastrointestinal & Colorectal Surgery Robotic and Minimally Invasive Surgery  [facility], [facility]  [facility]  [address], [address] [place], [place] [address] [phone] Fax [phone] Main  CONTACT INFORMATION:  Weekday (9AM-5PM): Call [facility] main office at [phone]  Weeknight (5PM-9AM) or Weekend/Holiday: Check  [email or web address] (password " [email or web address]") for General Surgery [facility] coverage  (Please, do not use SecureChat as it is not reliable communication to operating surgeons for immediate patient care)

## 2022-06-04 NOTE — Interval H&P Note (Signed)
History and Physical Interval Note:  06/04/2022 10:37 AM  Jim Moran  Moran presented today for surgery, with the diagnosis of DIVERTICULITIS.  The various methods of treatment have been discussed with the patient and family. After consideration of risks, benefits and other options for treatment, the patient Moran consented to  Procedure(s): XI ROBOTIC ASSISTED RECTOSIGMOID COLON RESECTION (N/A) POSSIBLE OSTOMY (N/A) RIGID PROCTOSCOPY (N/A) CYSTOSCOPY with FIREFLY INJECTION (N/A) as a surgical intervention.  The patient's history Moran been reviewed, patient examined, no change in status, stable for surgery.  I have reviewed the patient's chart and labs.  Questions were answered to the patient's satisfaction.    I have re-reviewed the the patient's records, history, medications, and allergies.  I have re-examined the patient.  I again discussed intraoperative plans and goals of post-operative recovery.  The patient agrees to proceed.  Jim Moran  [birth year] [id number]  Patient Care Team: Mattie Marlin, DO as PCP - General (Family Medicine) Jim Has, MD as Referring Physician (Family Medicine) Vida Rigger, MD as Consulting Physician (Gastroenterology) Jim Soda, MD as Consulting Physician (General Surgery)  Patient Active Problem List   Diagnosis Date Noted   Right inguinal hernia 04/13/2022   Perforation of sigmoid colon due to diverticulitis 04/10/2022   AKI (acute kidney injury) (HCC) 04/04/2022   Leukocytosis 04/04/2022   SIRS (systemic inflammatory response syndrome) (HCC) 04/04/2022   Abscess of sigmoid colon due to diverticulitis 03/30/2022   Hematochezia 03/30/2022   Diverticulitis of sigmoid colon with abscess 03/30/2022   History of adenomatous polyp of colon 03/30/2022   Diverticulitis 03/30/2022   Hypertension, essential 10/29/2020    Past Medical History:  Diagnosis Date   Chicken pox    Heart murmur     Past Surgical History:  Procedure Laterality  Date   CARPAL TUNNEL RELEASE Right    IR RADIOLOGIST EVAL & MGMT  04/22/2022   TONSILLECTOMY      Social History   Socioeconomic History   Marital status: Married    Spouse name: Not on file   Number of children: Not on file   Years of education: Not on file   Highest education level: Not on file  Occupational History   Not on file  Tobacco Use   Smoking status: Former    Packs/day: 0.50    Years: 20.00    Total pack years: 10.00    Types: Cigarettes    Quit date: 12/15/2008    Years since quitting: 13.4   Smokeless tobacco: Not on file  Vaping Use   Vaping Use: Never used  Substance and Sexual Activity   Alcohol use: Yes    Comment: Occasionally   Drug use: Never   Sexual activity: Not on file  Other Topics Concern   Not on file  Social History Narrative   Not on file   Social Determinants of Health   Financial Resource Strain: Not on file  Food Insecurity: Not on file  Transportation Needs: Not on file  Physical Activity: Not on file  Stress: Not on file  Social Connections: Not on file  Intimate Partner Violence: Not on file    Family History  Problem Relation Age of Onset   Leukemia Father 68   Diabetes Father    Cancer Father        leukemia    Medications Prior to Admission  Medication Sig Dispense Refill Last Dose   amLODipine (NORVASC) 5 MG tablet Take 5 mg by mouth daily.   06/03/2022  lisinopril (ZESTRIL) 20 MG tablet Take 20 mg by mouth daily.   06/04/2022 at 0600   Multiple Vitamin (MULTIVITAMIN WITH MINERALS) TABS tablet Take 1 tablet by mouth daily.   Past Week   polyethylene glycol (MIRALAX) 17 g packet Take 17 g by mouth daily as needed. (Patient taking differently: Take 17 g by mouth daily.) 14 each 0 06/02/2022   naproxen (NAPROSYN) 500 MG tablet Take 500 mg by mouth 2 (two) times daily as needed for moderate pain.   More than a month   sodium chloride flush (NS) 0.9 % SOLN 5 mLs by Intracatheter route daily. (Patient not taking: Reported  on 05/26/2022) 5 Syringe 1 Not Taking    Current Facility-Administered Medications  Medication Dose Route Frequency Provider Last Rate Last Admin   bisacodyl (DULCOLAX) EC tablet 20 mg  20 mg Oral Once Jim Soda, MD       bupivacaine liposome (EXPAREL) 1.3 % injection 266 mg  20 mL Infiltration Once Jim Soda, MD       cefoTEtan (CEFOTAN) 2 g in sodium chloride 0.9 % 100 mL IVPB  2 g Intravenous On Call to OR Jim Soda, MD       feeding supplement (ENSURE PRE-SURGERY) liquid 296 mL  296 mL Oral Once Jim Soda, MD       feeding supplement (ENSURE PRE-SURGERY) liquid 592 mL  592 mL Oral Once Jim Soda, MD       lactated ringers infusion   Intravenous Continuous Jim Iha, MD 10 mL/hr at 06/04/22 0907 New Bag at 06/04/22 0907   polyethylene glycol powder (GLYCOLAX/MIRALAX) container 255 g  1 Container Oral Once Jim Soda, MD         Allergies  Allergen Reactions   Oxycodone     Other reaction(s): Mental Status Changes (intolerance)   Morphine Other (See Comments)    Hallucinations    BP (!) 161/90 (BP Location: Right Arm)   Pulse 68   Temp 98 F (36.7 C) (Oral)   Resp 15   Ht 5\' 8"  (1.727 m)   Wt 89.4 kg   SpO2 99%   BMI 29.97 kg/m   Labs: Results for orders placed or performed during the hospital encounter of 06/04/22 (from the past 48 hour(s))  ABO/Rh     Status: None   Collection Time: 06/04/22  9:05 AM  Result Value Ref Range   ABO/RH(D)      O POS Performed at [facility], [address] [place], [place]     Imaging / Studies: No results found.   [id number], M.D., F.A.C.S. Gastrointestinal and Minimally Invasive Surgery [facility] [address], [address] [place], [place] [phone] Main / Paging  06/04/2022 10:37 AM    06/06/2022

## 2022-06-04 NOTE — Transfer of Care (Signed)
Immediate Anesthesia Transfer of Care Note  Patient: Jim Moran  Procedure(s) Performed: XI ROBOTIC ASSISTED DESCENDING SIGMOID COLON RESECTION, MOBILIZATION OF SPLENIC FLEXURE, INTRAOPERATIVE ASSESSMENT OF PERFUSION WITH FIREFLY, BILATERAL TAP BLOCK (Abdomen) POSSIBLE OSTOMY RIGID PROCTOSCOPY CYSTOSCOPY with FIREFLY INJECTION (Bladder)  Patient Location: PACU  Anesthesia Type:General  Level of Consciousness: awake, alert  and oriented  Airway & Oxygen Therapy: Patient Spontanous Breathing and Patient connected to face mask oxygen  Post-op Assessment: Report given to RN and Post -op Vital signs reviewed and stable  Post vital signs: Reviewed and stable  Last Vitals:  Vitals Value Taken Time  BP 132/78 06/04/22 1350  Temp    Pulse 87 06/04/22 1351  Resp 19 06/04/22 1351  SpO2 100 % 06/04/22 1351  Vitals shown include unvalidated device data.  Last Pain:  Vitals:   06/04/22 0850  TempSrc:   PainSc: 0-No pain         Complications: No notable events documented.

## 2022-06-04 NOTE — Discharge Instructions (Signed)
SURGERY: POST OP INSTRUCTIONS (Surgery for small bowel obstruction, colon resection, etc)   ######################################################################  EAT Gradually transition to a high fiber diet with a fiber supplement over the next few days after discharge  WALK Walk an hour a day.  Control your pain to do that.    CONTROL PAIN Control pain so that you can walk, sleep, tolerate sneezing/coughing, go up/down stairs.  HAVE A BOWEL MOVEMENT DAILY Keep your bowels regular to avoid problems.  OK to try a laxative to override constipation.  OK to use an antidairrheal to slow down diarrhea.  Call if not better after 2 tries  CALL IF YOU HAVE PROBLEMS/CONCERNS Call if you are still struggling despite following these instructions. Call if you have concerns not answered by these instructions  ######################################################################   DIET Follow a light diet the first few days at home.  Start with a bland diet such as soups, liquids, starchy foods, low fat foods, etc.  If you feel full, bloated, or constipated, stay on a ful liquid or pureed/blenderized diet for a few days until you feel better and no longer constipated. Be sure to drink plenty of fluids every day to avoid getting dehydrated (feeling dizzy, not urinating, etc.). Gradually add a fiber supplement to your diet over the next week.  Gradually get back to a regular solid diet.  Avoid fast food or heavy meals the first week as you are more likely to get nauseated. It is expected for your digestive tract to need a few months to get back to normal.  It is common for your bowel movements and stools to be irregular.  You will have occasional bloating and cramping that should eventually fade away.  Until you are eating solid food normally, off all pain medications, and back to regular activities; your bowels will not be normal. Focus on eating a low-fat, high fiber diet the rest of your life  (See Getting to Good Bowel Health, below).  CARE of your INCISION or WOUND  It is good for closed incisions and even open wounds to be washed every day.  Shower every day.  Short baths are fine.  Wash the incisions and wounds clean with soap & water.    You may leave closed incisions open to air if it is dry.   You may cover the incision with clean gauze & replace it after your daily shower for comfort.  TEGADERM:  You have clear gauze band-aid dressings over your closed incision(s).  Remove the dressings 3 days after surgery.    If you have an open wound with a wound vac, see wound vac care instructions.    ACTIVITIES as tolerated Start light daily activities --- self-care, walking, climbing stairs-- beginning the day after surgery.  Gradually increase activities as tolerated.  Control your pain to be active.  Stop when you are tired.  Ideally, walk several times a day, eventually an hour a day.   Most people are back to most day-to-day activities in a few weeks.  It takes 4-8 weeks to get back to unrestricted, intense activity. If you can walk 30 minutes without difficulty, it is safe to try more intense activity such as jogging, treadmill, bicycling, low-impact aerobics, swimming, etc. Save the most intensive and strenuous activity for last (Usually 4-8 weeks after surgery) such as sit-ups, heavy lifting, contact sports, etc.  Refrain from any intense heavy lifting or straining until you are off narcotics for pain control.  You will have off days, but   things should improve week-by-week. DO NOT PUSH THROUGH PAIN.  Let pain be your guide: If it hurts to do something, don't do it.  Pain is your body warning you to avoid that activity for another week until the pain goes down. You may drive when you are no longer taking narcotic prescription pain medication, you can comfortably wear a seatbelt, and you can safely make sudden turns/stops to protect yourself without hesitating due to pain. You may  have sexual intercourse when it is comfortable. If it hurts to do something, stop.  MEDICATIONS Take your usually prescribed home medications unless otherwise directed.   Blood thinners:  Usually you can restart any strong blood thinners after the second postoperative day.  It is OK to take aspirin right away.     If you are on strong blood thinners (warfarin/Coumadin, Plavix, Xerelto, Eliquis, Pradaxa, etc), discuss with your surgeon, medicine PCP, and/or cardiologist for instructions on when to restart the blood thinner & if blood monitoring is needed (PT/INR blood check, etc).     PAIN CONTROL Pain after surgery or related to activity is often due to strain/injury to muscle, tendon, nerves and/or incisions.  This pain is usually short-term and will improve in a few months.  To help speed the process of healing and to get back to regular activity more quickly, DO THE FOLLOWING THINGS TOGETHER: Increase activity gradually.  DO NOT PUSH THROUGH PAIN Use Ice and/or Heat Try Gentle Massage and/or Stretching Take over the counter pain medication Take Narcotic prescription pain medication for more severe pain  Good pain control = faster recovery.  It is better to take more medicine to be more active than to stay in bed all day to avoid medications.  Increase activity gradually Avoid heavy lifting at first, then increase to lifting as tolerated over the next 6 weeks. Do not "push through" the pain.  Listen to your body and avoid positions and maneuvers than reproduce the pain.  Wait a few days before trying something more intense Walking an hour a day is encouraged to help your body recover faster and more safely.  Start slowly and stop when getting sore.  If you can walk 30 minutes without stopping or pain, you can try more intense activity (running, jogging, aerobics, cycling, swimming, treadmill, sex, sports, weightlifting, etc.) Remember: If it hurts to do it, then don't do it! Use Ice and/or  Heat You will have swelling and bruising around the incisions.  This will take several weeks to resolve. Ice packs or heating pads (6-8 times a day, 30-60 minutes at a time) will help sooth soreness & bruising. Some people prefer to use ice alone, heat alone, or alternate between ice & heat.  Experiment and see what works best for you.  Consider trying ice for the first few days to help decrease swelling and bruising; then, switch to heat to help relax sore spots and speed recovery. Shower every day.  Short baths are fine.  It feels good!  Keep the incisions and wounds clean with soap & water.   Try Gentle Massage and/or Stretching Massage at the area of pain many times a day Stop if you feel pain - do not overdo it Take over the counter pain medication This helps the muscle and nerve tissues become less irritable and calm down faster Choose ONE of the following over-the-counter anti-inflammatory medications: Acetaminophen 500mg tabs (Tylenol) 1-2 pills with every meal and just before bedtime (avoid if you have liver problems or   if you have acetaminophen in you narcotic prescription) Naproxen 220mg tabs (ex. Aleve, Naprosyn) 1-2 pills twice a day (avoid if you have kidney, stomach, IBD, or bleeding problems) Ibuprofen 200mg tabs (ex. Advil, Motrin) 3-4 pills with every meal and just before bedtime (avoid if you have kidney, stomach, IBD, or bleeding problems) Take with food/snack several times a day as directed for at least 2 weeks to help keep pain / soreness down & more manageable. Take Narcotic prescription pain medication for more severe pain A prescription for strong pain control is often given to you upon discharge (for example: oxycodone/Percocet, hydrocodone/Norco/Vicodin, or tramadol/Ultram) Take your pain medication as prescribed. Be mindful that most narcotic prescriptions contain Tylenol (acetaminophen) as well - avoid taking too much Tylenol. If you are having problems/concerns with  the prescription medicine (does not control pain, nausea, vomiting, rash, itching, etc.), please call us [phone] to see if we need to switch you to a different pain medicine that will work better for you and/or control your side effects better. If you need a refill on your pain medication, you must call the office before 4 pm and on weekdays only.  By federal law, prescriptions for narcotics cannot be called into a pharmacy.  They must be filled out on paper & picked up from our office by the patient or authorized caretaker.  Prescriptions cannot be filled after 4 pm nor on weekends.    WHEN TO CALL US [phone] Severe uncontrolled or worsening pain  Fever over 101 F (38.5 C) Concerns with the incision: Worsening pain, redness, rash/hives, swelling, bleeding, or drainage Reactions / problems with new medications (itching, rash, hives, nausea, etc.) Nausea and/or vomiting Difficulty urinating Difficulty breathing Worsening fatigue, dizziness, lightheadedness, blurred vision Other concerns If you are not getting better after two weeks or are noticing you are getting worse, contact our office [phone] for further advice.  We may need to adjust your medications, re-evaluate you in the office, send you to the emergency room, or see what other things we can do to help. The clinic staff is available to answer your questions during regular business hours (8:30am-5pm).  Please don't hesitate to call and ask to speak to one of our nurses for clinical concerns.    A surgeon from [facility] is always on call at the hospitals 24 hours/day If you have a medical emergency, go to the nearest emergency room or call 911.  FOLLOW UP in our office One the day of your discharge from the hospital (or the next business weekday), please call [facility] to set up or confirm an appointment to see your surgeon in the office for a follow-up appointment.  Usually it is 2-3 weeks  after your surgery.   If you have skin staples at your incision(s), let the office know so we can set up a time in the office for the nurse to remove them (usually around 10 days after surgery). Make sure that you call for appointments the day of discharge (or the next business weekday) from the hospital to ensure a convenient appointment time. IF YOU HAVE DISABILITY OR FAMILY LEAVE FORMS, BRING THEM TO THE OFFICE FOR PROCESSING.  DO NOT GIVE THEM TO YOUR DOCTOR.  [facility] [address], [address], [place], [place]  [address] ? [phone] - Main [phone] - Toll Free,  [phone] - Fax [email or web address]    GETTING TO GOOD BOWEL HEALTH. It is expected for your   digestive tract to need a few months to get back to normal.  It is common for your bowel movements and stools to be irregular.  You will have occasional bloating and cramping that should eventually fade away.  Until you are eating solid food normally, off all pain medications, and back to regular activities; your bowels will not be normal.   Avoiding constipation The goal: ONE SOFT BOWEL MOVEMENT A DAY!    Drink plenty of fluids.  Choose water first. TAKE A FIBER SUPPLEMENT EVERY DAY THE REST OF YOUR LIFE During your first week back home, gradually add back a fiber supplement every day Experiment which form you can tolerate.   There are many forms such as powders, tablets, wafers, gummies, etc Psyllium bran (Metamucil), methylcellulose (Citrucel), Miralax or Glycolax, Benefiber, Flax Seed.  Adjust the dose week-by-week (1/2 dose/day to 6 doses a day) until you are moving your bowels 1-2 times a day.  Cut back the dose or try a different fiber product if it is giving you problems such as diarrhea or bloating. Sometimes a laxative is needed to help jump-start bowels if constipated until the fiber supplement can help regulate your bowels.  If you are tolerating eating & you are farting, it  is okay to try a gentle laxative such as double dose MiraLax, prune juice, or Milk of Magnesia.  Avoid using laxatives too often. Stool softeners can sometimes help counteract the constipating effects of narcotic pain medicines.  It can also cause diarrhea, so avoid using for too long. If you are still constipated despite taking fiber daily, eating solids, and a few doses of laxatives, call our office. Controlling diarrhea Try drinking liquids and eating bland foods for a few days to avoid stressing your intestines further. Avoid dairy products (especially milk & ice cream) for a short time.  The intestines often can lose the ability to digest lactose when stressed. Avoid foods that cause gassiness or bloating.  Typical foods include beans and other legumes, cabbage, broccoli, and dairy foods.  Avoid greasy, spicy, fast foods.  Every person has some sensitivity to other foods, so listen to your body and avoid those foods that trigger problems for you. Probiotics (such as active yogurt, Align, etc) may help repopulate the intestines and colon with normal bacteria and calm down a sensitive digestive tract Adding a fiber supplement gradually can help thicken stools by absorbing excess fluid and retrain the intestines to act more normally.  Slowly increase the dose over a few weeks.  Too much fiber too soon can backfire and cause cramping & bloating. It is okay to try and slow down diarrhea with a few doses of antidiarrheal medicines.   Bismuth subsalicylate (ex. Kayopectate, Pepto Bismol) for a few doses can help control diarrhea.  Avoid if pregnant.   Loperamide (Imodium) can slow down diarrhea.  Start with one tablet (2mg) first.  Avoid if you are having fevers or severe pain.  ILEOSTOMY PATIENTS WILL HAVE CHRONIC DIARRHEA since their colon is not in use.    Drink plenty of liquids.  You will need to drink even more glasses of water/liquid a day to avoid getting dehydrated. Record output from your  ileostomy.  Expect to empty the bag every 3-4 hours at first.  Most people with a permanent ileostomy empty their bag 4-6 times at the least.   Use antidiarrheal medicine (especially Imodium) several times a day to avoid getting dehydrated.  Start with a dose at bedtime & breakfast.    Adjust up or down as needed.  Increase antidiarrheal medications as directed to avoid emptying the bag more than 8 times a day (every 3 hours). Work with your wound ostomy nurse to learn care for your ostomy.  See ostomy care instructions. TROUBLESHOOTING IRREGULAR BOWELS 1) Start with a soft & bland diet. No spicy, greasy, or fried foods.  2) Avoid gluten/wheat or dairy products from diet to see if symptoms improve. 3) Miralax 17gm or flax seed mixed in 8oz. water or juice-daily. May use 2-4 times a day as needed. 4) Gas-X, Phazyme, etc. as needed for gas & bloating.  5) Prilosec (omeprazole) over-the-counter as needed 6)  Consider probiotics (Align, Activa, etc) to help calm the bowels down  Call your doctor if you are getting worse or not getting better.  Sometimes further testing (cultures, endoscopy, X-ray studies, CT scans, bloodwork, etc.) may be needed to help diagnose and treat the cause of the diarrhea. [facility] [address], [address], [place], [place]  [address] [phone] - Main.    [phone]  - Toll Free.   [phone] - Fax [email or web address]  

## 2022-06-04 NOTE — Op Note (Signed)
06/04/2022  1:58 PM  PATIENT:  Jim Moran  61 y.o. male  Patient Care Team: Mattie Marlin, DO as PCP - General (Family Medicine) Farris Has, MD as Referring Physician (Family Medicine) Vida Rigger, MD as Consulting Physician (Gastroenterology) Karie Soda, MD as Consulting Physician (General Surgery)  PRE-OPERATIVE DIAGNOSIS:  DESCENDING & SIGMOID COLON DIVERTICULITIS  POST-OPERATIVE DIAGNOSIS:   DESCENDING & SIGMOID COLON DIVERTICULITIS Right inguinal hernia.  Reducible.  PROCEDURE:   XI ROBOTIC ASSISTED DESCENDING SIGMOID COLON RESECTION MOBILIZATION OF SPLENIC FLEXURE OF COLON INTRAOPERATIVE ASSESSMENT OF PERFUSION WITH FIREFLY TRANSVERSUS ABDOMINIS PLANE (TAP) BLOCK - BILATERAL RIGID PROCTOSCOPY  SURGEON:  Ardeth Sportsman, MD  ASSISTANT: Marin Olp, MD An experienced assistant was required given the standard of surgical care given the complexity of the case.  This assistant was needed for exposure, dissection, suction, tissue approximation, retraction, perception, etc.  ANESTHESIA:     General  Regional TRANSVERSUS ABDOMINIS PLANE (TAP) nerve block for perioperative & postoperative pain control provided with liposomal bupivacaine (Experel) mixed with 0.25% bupivacaine as a Bilateral TAP block x 60mL each side at the level of the transverse abdominis & preperitoneal spaces along the flank at the anterior axillary line, from subcostal ridge to iliac crest under laparoscopic guidance   Local field block at port sites & extraction wound  EBL:  Total I/O In: 1500 [I.V.:1400; IV Piggyback:100] Out: 400 [Urine:400]  Delay start of Pharmacological VTE agent (>24hrs) due to surgical blood loss or risk of bleeding:  no  DRAINS: No  SPECIMEN:   DESCENDING & RECTOSIGMOID COLON (open end proximal) DISTAL ANASTOMOTIC RING (final distal margin)  DISPOSITION OF SPECIMEN:  PATHOLOGY  COUNTS:  YES  PLAN OF CARE: Admit to inpatient   PATIENT DISPOSITION:  PACU  - hemodynamically stable.  INDICATION:    Pleasant patient who developed left lower quadrant pain consistent with diverticulitis.  Struggle and found to have abscess in the descending colon.  Drain placement.  Larey Seat out.  Then had recurrent abscess involving a right inguinal hernia.  Eventually stabilized with oral antibiotics.  Given the persistent and recurrent attacks with multi focal abscesses over period of months, I recommended segmental colonic resection.  Patient underwent colonoscopy by Dr. Levora Angel with [facility].  Few polyps noted and thickening and irritation 30 cm from the anus at the descending/sigmoid junction.  Otherwise no surprises.  I recommended segmental resection:  The anatomy & physiology of the digestive tract was discussed.  The pathophysiology was discussed.  Natural history risks without surgery was discussed.   I worked to give an overview of the disease and the frequent need to have multispecialty involvement.  I feel the risks of no intervention will lead to serious problems that outweigh the operative risks; therefore, I recommended a partial colectomy to remove the pathology.  Laparoscopic & open techniques were discussed.    Risks such as bleeding, infection, abscess, leak, reoperation, possible ostomy, hernia, heart attack, death, and other risks were discussed.  I noted a good likelihood this will help address the problem.   Goals of post-operative recovery were discussed as well.  We will work to minimize complications.  Educational materials on the pathology had been given in the office.  Questions were answered.    The patient expressed understanding & wished to proceed with surgery.  OR FINDINGS:   Patient had phlegmon involving the distal descending and sigmoid colon with irritation and retroperitoneal inflammation.  Consistent with persistent diverticulitis.  No definite discrete abscess.  Mobilization  of splenic flexure colon done so that the proximal/mid  descending colon could be used for anastomosis where it was more clean and noninflamed.  No obvious metastatic disease on visceral parietal peritoneum or liver.  It is a 31 EEA end-to-end stapled anastomosis the anastomosis rests 14 cm from the anal verge by rigid proctoscopy.  Right inguinal hernia of note.  Stable and reducible.  Left alone  CASE DATA:  Type of patient?: Elective [facility] Private Case  Status of Case? Elective Scheduled  Infection Present At Time Of Surgery (PATOS)?  PHLEGMON  DESCRIPTION:   Informed consent was confirmed.  The patient underwent general anaesthesia without difficulty.  The patient was positioned appropriately.  VTE prevention in place.  Given the patient having history of complex diverticulitis with abscesses and phlegmon along the left retroperitoneum and side, I asked Dr. Laverle PatterBorden with [facility] to perform cystoscopy with ICG firefly infiltration of the ureters for identification.  Please see his separate operative note.  The patient was clipped, prepped, & draped in a sterile fashion.  Surgical timeout confirmed our plan.  The patient was positioned in reverse Trendelenburg.  Abdominal entry was gained using Varess technique at the left subcostal ridge on the anterior abdominal wall.  No elevated EtCO2 noted.  Port placed.  Camera inspection revealed no injury.  Extra ports were carefully placed under direct laparoscopic visualization.  Upon entering the abdomen (organ space), I encountered a phlegmon involving the descending/sigmoid colon junction. .   I reflected the greater omentum and the upper abdomen the small bowel in the upper abdomen.  The patient was carefully positioned.  The Intuitive daVinci robot was docked with camera & instruments carefully placed.  The patient had thickening.  Decided use a medial to lateral approach to come underneath the phlegmon.  I mobilized the rectosigmoid colon & elevated it to put the main pedicle on tension.  I  scored the base of peritoneum of the medial side of the mesentery of the elevated left colon from the ligament of Treitz to the mid rectum.   I elevated the sigmoid mesentery and entered into the retro-mesenteric plane. We were able to identify the left ureter and gonadal vessels. We kept those posterior within the retroperitoneum and elevated the left colon mesentery off that. I did isolate the inferior mesenteric artery (IMA) pedicle but did not ligate it yet.  I continued distally and got into the avascular plane posterior to the mesorectum, sparing the nervi ergentes.. This allowed me to help mobilize the rectum as well by freeing the mesorectum off the sacrum.  I stayed away from the right and left ureters.  I kept the lateral vascular pedicles to the rectum intact.  I skeletonized the lymph nodes off the inferior mesenteric artery pedicle.  I went down to its takeoff from the aorta.   I isolated the inferior mesenteric vein off of the ligament of Treitz just cephalad to that as well.  After confirming the left ureter was out of the way, I went ahead and ligated the inferior mesenteric artery pedicle just near its takeoff from the aorta.  I did ligate the inferior mesenteric vein in a similar fashion.  We ensured hemostasis.  I continued medial to lateral dissection to free the left colon mesentery off the retroperitoneum going up towards the splenic flexure to allow good mobility and protect the colon mesentery.  I mobilized the left colon in a lateral to medial fashion off the retroperitoneum and sidewall attachments along the  line of Toldt up towards the splenic flexure to ensure good mobilization of the remaining left colon to reach into the pelvis.   We then focused on mesorectal dissection.  Freed the mesorectum off the presacral plane until I was distal to the concerning region.  Freed off peritoneum on the lateral sidewalls as well and transected the mesentery of the lateral pedicles to get distal  to the area of concern.  Came around anteriorly such that I had good circumferential mesorectal excision and a good margin distal to the area of concern.  Initially was planned to use a distal ascending colon but there is still thickening and inflammation there.  Reviewing CAT scans he had had an abscess sitting in the descending colon up to the mid descending colon.  I did not feel it was safe to use that for anastomosis.  Therefore decided to do formal splenic flexure mobilization.  Mobilized the descending colon up towards the splenic flexure in a lateral to medial fashion.  We then reposition and redocked the patient in reverse and delivered.  Found an area in the mid transverse colon and transected the greater omentum off this heading more distally towards the splenic flexure.  He a lot of folding and wads and thickening of the greater omentum but eventually carefully freed that off the splenic flexure.  I then went through the mid transverse colon mesentery just lateral to the middle colic pedicle and inferior to the obvious pancreatic ridge.  I entered into my retroperitoneal dissection.  I transected the mid and distal transverse colon and splenic flexure off its attachments to the inferior pancreatic ridge and retroperitoneum.  Without I got full mobilization all the way up to the mid transverse colon to good result.  I chose a region at the proximal/mid descending colon junction that was soft and easily reached down to the rectal stump.  I transected the mesentery of the colon radially to preserve remaining colon blood supply.  I skeletonized the mesorectum at the proximal rectum.  Given the dissection and need for splenic flexure mobilization, decided to double check tissue perfusion using firefly.  To access vascular perfusion of tissues, we asked anesthesia use intravenous  indocyanine green (ICG) with IV flush.  I switched to the NIR fluorescence (Firefly mode) imaging window on the daVinci robot  platform.  We were able to see good light green visualization of blood vessels with good vascular perfusion of tissues, confirming good tissue perfusion of tissues planned for anastomosis.  We transected at the proximal rectum using a robotic stapler.  We then chose a region for the proximal margin that would reach well for our planned anastomosis.  Transected the colon mesentery radially to preserve good collateral and marginal artery blood supply.  We created an extraction incision through a small Pfannenstiel incision in the suprapubic region.  Placed a wound protector.  I was able to eviscerate the rectosigmoid and descending colon out the wound.   I clamped the colon proximal to this area using a reusable pursestringer device.  Passed a 2-0 Keith needle. I transected at the descending/sigmoid junction with a scalpel. I got healthy bleeding mucosa.  We sent the rectosigmoid colon specimen off to go to pathology.  We sized the colon orifice.  I chose a 51mm EEA anvil stapler system.  I reinforced the prolene pursestring with interrupted silk "belt loop" sutures.  I placed the anvil to the open end of the proximal remaining colon and closed around it using the  pursestring.    We did copious irrigation with crystalloid solution.  Hemostasis was good.  The distal end of the remaining colon easily reached down to the rectal stump     Dr Cliffton Asters scrubbed down and did gentle anal dilation and advanced the EEA stapler up the rectal stump. The spike was brought out at the provimal end of the rectal stump under direct visualization.  I  attached the anvil of the proximal colon the spike of the stapler. Anvil was tightened down and held clamped for 60 seconds.  Orientation was confirmed such that there is no twisting of the colon nor small bowel underneath the mesenteric defect. No concerning tension.  The EEA stapler was fired and held clamped for 30 seconds. The stapler was released & removed. Blue stitch is in the  proximal ring.  Care was taken to ensure no other structures were incorporated within this either.  We noted 2 excellent anastomotic rings.   The colon proximal to the anastomosis was then gently occluded. The pelvis was filled with sterile irrigation.  Dr Cliffton Asters  did rigid proctoscopy noted the anastomosis was at 14 cm from the anal verge consistent with the proximal rectum.  There was a negative air leak test. There was no tension of mesentery or bowel at the anastomosis.   Tissues looked viable.  Ureters & bowel uninjured.  The anastomosis looked healthy. Greater omentum positioned down into the pelvis to help protect the anastomosis.  Endoluminal gas was evacuated.  Ports & wound protector removed.  We changed gloves & redraped the patient per colon SSI prevention protocol.  We aspirated the sterile irrigation.  Hemostasis was good.  Sterile unused instruments were used from this point.  I closed the skin at the port sites using Monocryl stitch and sterile dressing.  We assured hemostasis and the former ostomy wound.  Wound irrigated.  I closed the posterior rectus fascia with 0 Vicryl suture.  Anterior rectus fascia was closed using #1 PDS transversely.  Port sites and skin closed with 4 Monocryl suture.  Sterile dressing placed.   Patient is being extubated go to recovery room. I had discussed postop care with the patient in detail the office & in the holding area. Instructions are written. I discussed operative findings, updated the patient's status, discussed probable steps to recovery, and gave postoperative recommendations to the patient's spouse, Vonna Drafts.  Recommendations were made.  Questions were answered.  She expressed understanding & appreciation.  Ardeth Sportsman, M.D., F.A.C.S. Gastrointestinal and Minimally Invasive Surgery [facility] [address], [address] [place], [place] [address] [phone] Main / Paging

## 2022-06-04 NOTE — Anesthesia Procedure Notes (Signed)
Procedure Name: Intubation Date/Time: 06/04/2022 10:53 AM  Performed by: Pearson Grippe, CRNAPre-anesthesia Checklist: Patient identified, Emergency Drugs available, Suction available and Patient being monitored Patient Re-evaluated:Patient Re-evaluated prior to induction Oxygen Delivery Method: Circle system utilized Preoxygenation: Pre-oxygenation with 100% oxygen Induction Type: IV induction Ventilation: Mask ventilation without difficulty Laryngoscope Size: Miller and 2 Grade View: Grade I Tube type: Oral Number of attempts: 1 Airway Equipment and Method: Stylet and Oral airway Placement Confirmation: ETT inserted through vocal cords under direct vision, positive ETCO2 and breath sounds checked- equal and bilateral Secured at: 23 cm Tube secured with: Tape Dental Injury: Teeth and Oropharynx as per pre-operative assessment

## 2022-06-04 NOTE — Anesthesia Preprocedure Evaluation (Addendum)
Anesthesia Evaluation  Patient identified by MRN, date of birth, ID band Patient awake    Reviewed: Allergy & Precautions, NPO status , Patient's Chart, lab work & pertinent test results  Airway Mallampati: II  TM Distance: >3 FB Neck ROM: Full    Dental no notable dental hx. (+) Chipped, Dental Advisory Given,    Pulmonary neg pulmonary ROS, former smoker,    Pulmonary exam normal breath sounds clear to auscultation       Cardiovascular hypertension, Pt. on medications Normal cardiovascular exam Rhythm:Regular Rate:Normal     Neuro/Psych negative neurological ROS  negative psych ROS   GI/Hepatic negative GI ROS, Neg liver ROS,   Endo/Other  negative endocrine ROS  Renal/GU negative Renal ROS  negative genitourinary   Musculoskeletal negative musculoskeletal ROS (+)   Abdominal   Peds  Hematology negative hematology ROS (+)   Anesthesia Other Findings diverticulitis  Reproductive/Obstetrics                            Anesthesia Physical Anesthesia Plan  ASA: 2  Anesthesia Plan: General   Post-op Pain Management: Tylenol PO (pre-op)* and Ketamine IV*   Induction: Intravenous  PONV Risk Score and Plan: 2 and Midazolam, Dexamethasone and Ondansetron  Airway Management Planned: Oral ETT  Additional Equipment:   Intra-op Plan:   Post-operative Plan: Extubation in OR  Informed Consent: I have reviewed the patients History and Physical, chart, labs and discussed the procedure including the risks, benefits and alternatives for the proposed anesthesia with the patient or authorized representative who has indicated his/her understanding and acceptance.     Dental advisory given  Plan Discussed with: CRNA  Anesthesia Plan Comments:         Anesthesia Quick Evaluation

## 2022-06-04 NOTE — Anesthesia Postprocedure Evaluation (Signed)
Anesthesia Post Note  Patient: Jim Moran  Procedure(s) Performed: XI ROBOTIC ASSISTED DESCENDING SIGMOID COLON RESECTION, MOBILIZATION OF SPLENIC FLEXURE, INTRAOPERATIVE ASSESSMENT OF PERFUSION WITH FIREFLY, BILATERAL TAP BLOCK (Abdomen) POSSIBLE OSTOMY RIGID PROCTOSCOPY CYSTOSCOPY with FIREFLY INJECTION (Bladder)     Patient location during evaluation: PACU Anesthesia Type: General Level of consciousness: awake and alert Pain management: pain level controlled Vital Signs Assessment: post-procedure vital signs reviewed and stable Respiratory status: spontaneous breathing, nonlabored ventilation, respiratory function stable and patient connected to nasal cannula oxygen Cardiovascular status: blood pressure returned to baseline and stable Postop Assessment: no apparent nausea or vomiting Anesthetic complications: no   No notable events documented.  Last Vitals:  Vitals:   06/04/22 1545 06/04/22 1634  BP: 107/74 130/87  Pulse: 81 84  Resp: 13   Temp:    SpO2: 98% 100%    Last Pain:  Vitals:   06/04/22 1642  TempSrc:   PainSc: 5                  Damarie Schoolfield L Saraya Tirey

## 2022-06-05 ENCOUNTER — Encounter (HOSPITAL_COMMUNITY): Payer: Self-pay | Admitting: Surgery

## 2022-06-05 LAB — BASIC METABOLIC PANEL
Anion gap: 8 (ref 5–15)
BUN: 11 mg/dL (ref 6–20)
CO2: 25 mmol/L (ref 22–32)
Calcium: 8.7 mg/dL — ABNORMAL LOW (ref 8.9–10.3)
Chloride: 104 mmol/L (ref 98–111)
Creatinine, Ser: 0.95 mg/dL (ref 0.61–1.24)
GFR, Estimated: >60 mL/min (ref >60)
Glucose, Bld: 107 mg/dL — ABNORMAL HIGH (ref 70–99)
Potassium: 4.2 mmol/L (ref 3.5–5.1)
Sodium: 137 mmol/L (ref 135–145)

## 2022-06-05 LAB — MAGNESIUM: Magnesium: 1.7 mg/dL (ref 1.7–2.4)

## 2022-06-05 LAB — CBC
HCT: 35.6 % — ABNORMAL LOW (ref 39.0–52.0)
Hemoglobin: 11.5 g/dL — ABNORMAL LOW (ref 13.0–17.0)
MCH: 31.6 pg (ref 26.0–34.0)
MCHC: 32.3 g/dL (ref 30.0–36.0)
MCV: 97.8 fL (ref 80.0–100.0)
Platelets: 191 10*3/uL (ref 150–400)
RBC: 3.64 MIL/uL — ABNORMAL LOW (ref 4.22–5.81)
RDW: 14.2 % (ref 11.5–15.5)
WBC: 10.6 10*3/uL — ABNORMAL HIGH (ref 4.0–10.5)
nRBC: 0 % (ref 0.0–0.2)

## 2022-06-05 NOTE — Progress Notes (Signed)
Transition of Care [facility]) Screening Note  Patient Details  Name: Jim Moran Date of Birth: 14-Sep-1961  [facility] CM/SW Contact:    Ewing Schlein, LCSW Phone Number: 06/05/2022, 10:45 AM  Transition of Care Department [facility]) has reviewed patient and no TOC needs have been identified at this time. We will continue to monitor patient advancement through interdisciplinary progression rounds. If new patient transition needs arise, please place a TOC consult.

## 2022-06-08 LAB — SURGICAL PATHOLOGY

## 2022-06-29 ENCOUNTER — Ambulatory Visit: Payer: Self-pay | Admitting: Surgery

## 2024-03-26 IMAGING — CT CT ABD-PELV W/ CM
2 of 5 series · 14 of 46 positions shown, 16 images · IV contrast (agent unspecified)
Comparison: 04/03/2022

CLINICAL DATA: Pain right inguinal region

EXAM:
CT ABDOMEN AND PELVIS WITH CONTRAST
TECHNIQUE: Multidetector CT imaging of the abdomen and pelvis was performed
using the standard protocol following bolus administration of
intravenous contrast.

[Series 2: axial st · axial · 0.78mm/px · z∈[+988,+1498]mm · 11 of 116 slices shown, 13 images]
[im 7/116  soft-tissue]
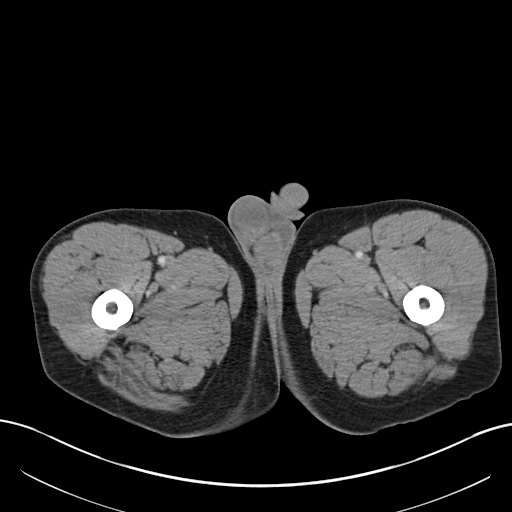
[im 7/116  bone]
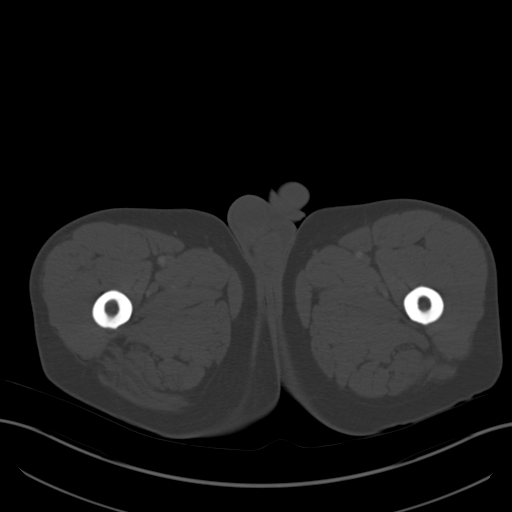
[im 21/116  soft-tissue]
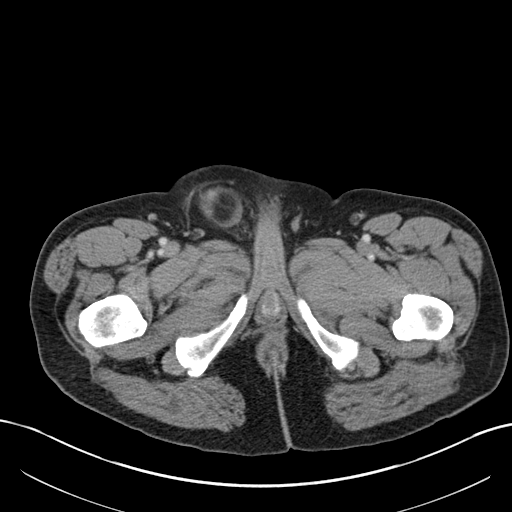
[im 28/116  soft-tissue]
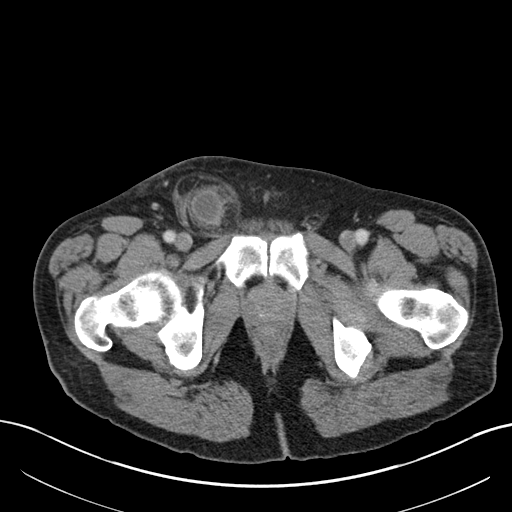
[im 41/116  soft-tissue]
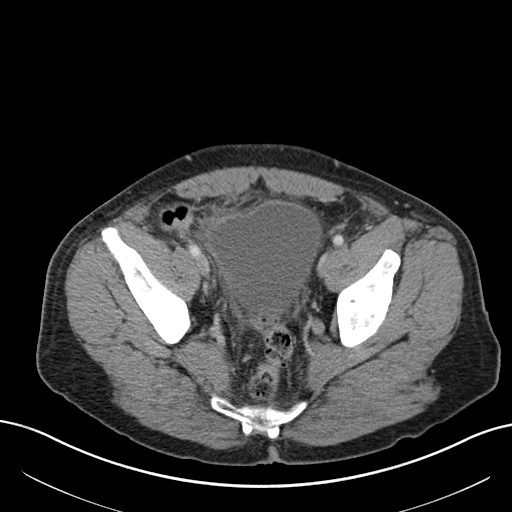
[im 48/116  soft-tissue]
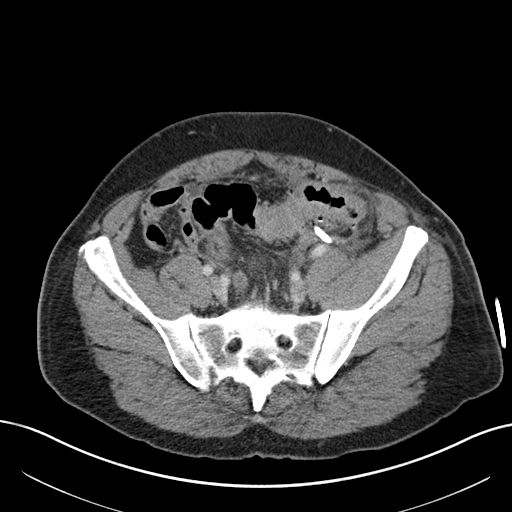
[im 61/116  soft-tissue]
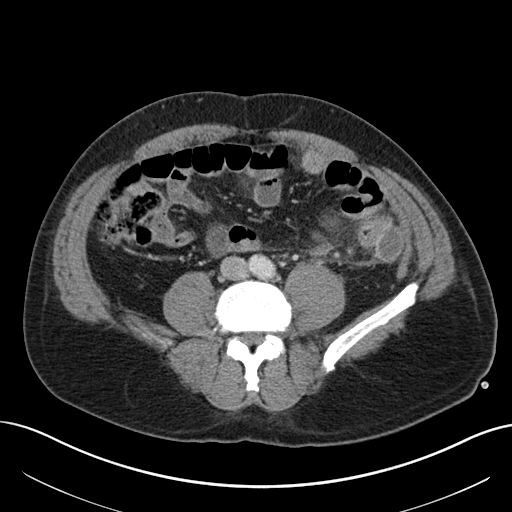
[im 68/116  soft-tissue]
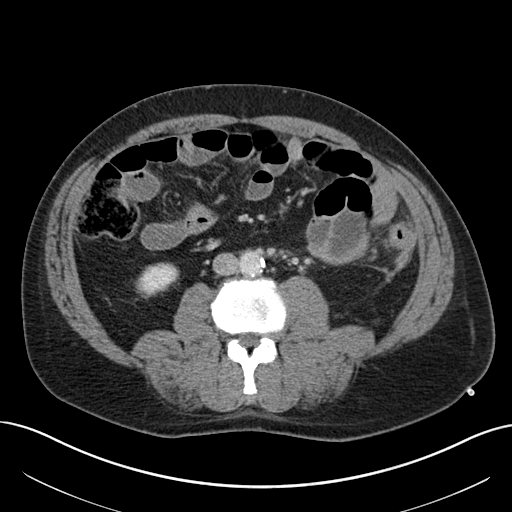
[im 75/116  soft-tissue]
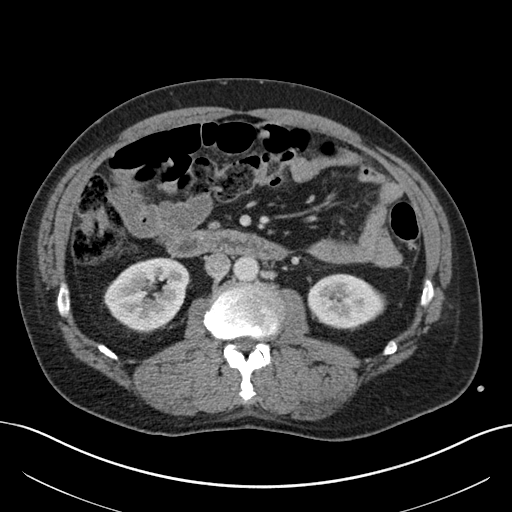
[im 88/116  soft-tissue]
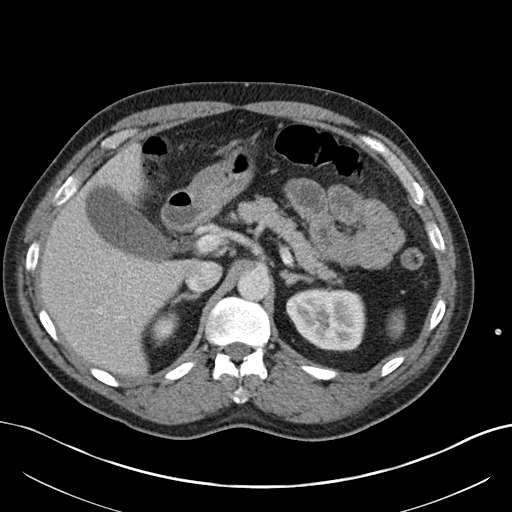
[im 88/116  bone]
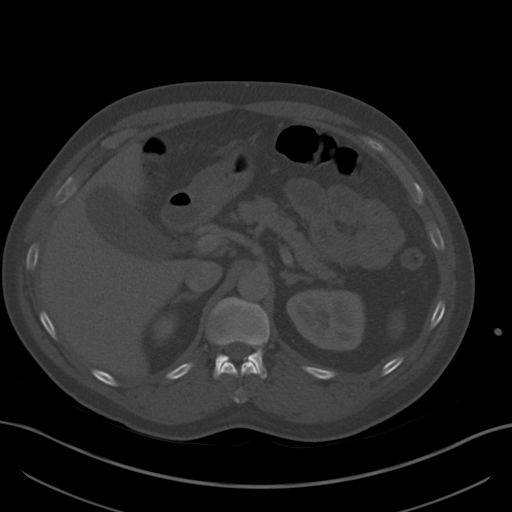
[im 95/116  soft-tissue]
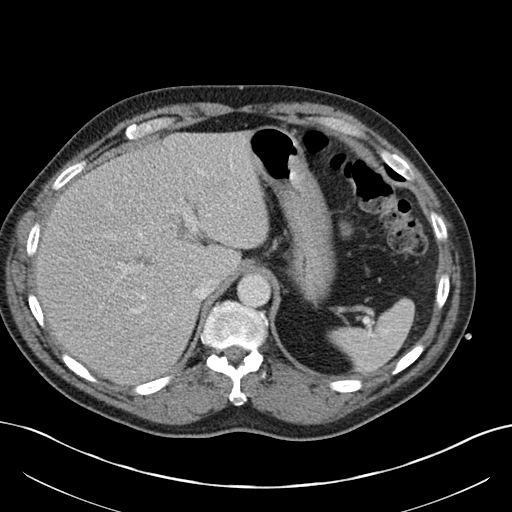
[im 109/116  soft-tissue]
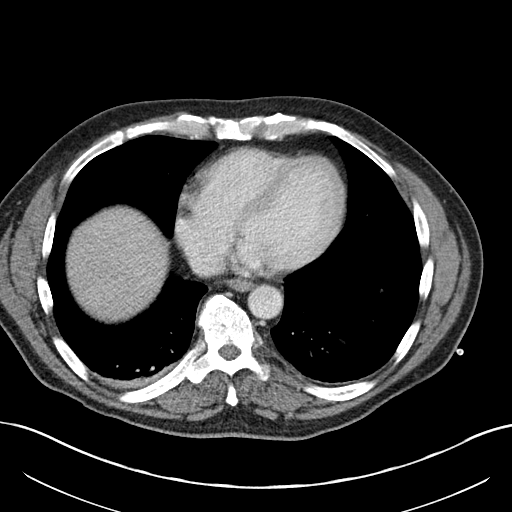

[Series 5: coronal st · coronal · 0.77mm/px · 3 of 154 slices shown]
[im 52/154  soft-tissue]
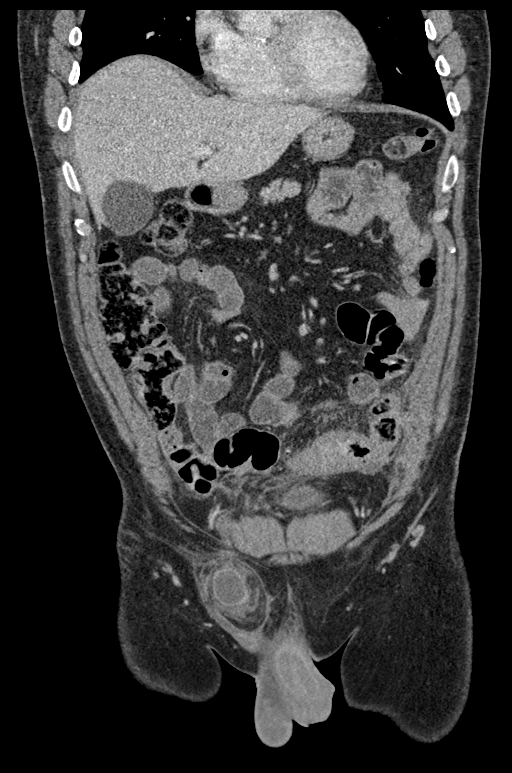
[im 69/154  soft-tissue]
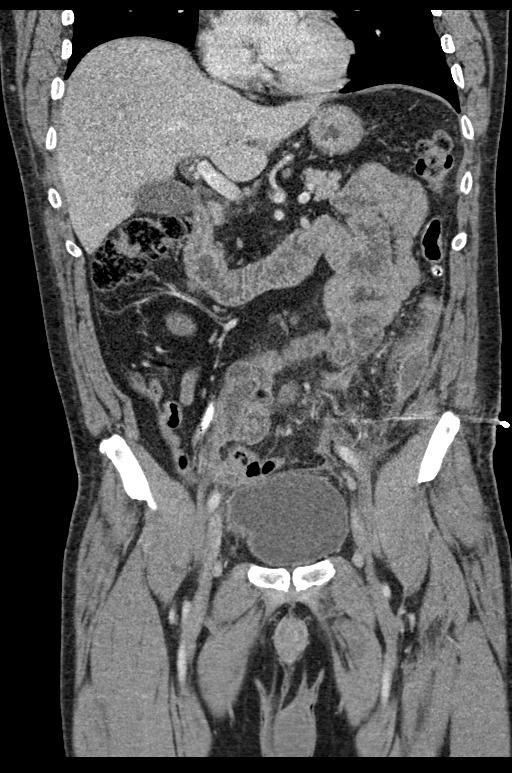
[im 86/154  soft-tissue]
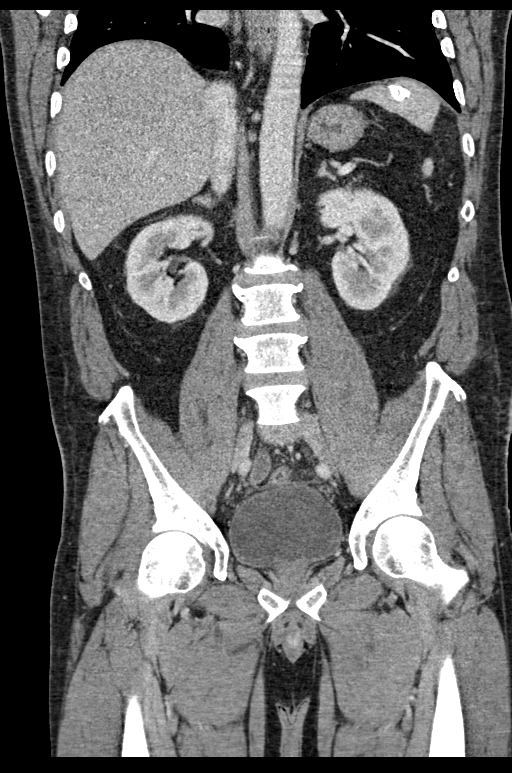

[14 of 46 positions shown; findings below may reference images not displayed]

RADIATION DOSE REDUCTION: This exam was performed according to the
departmental dose-optimization program which includes automated
exposure control, adjustment of the mA and/or kV according to
patient size and/or use of iterative reconstruction technique.

CONTRAST:  100mL OMNIPAQUE IOHEXOL 300 MG/ML  SOLN
FINDINGS: Lower chest: Linear density is seen in the posterior right lower
lung fields suggesting subsegmental atelectasis. There is no pleural
effusion.

Hepatobiliary: There is fatty infiltration in the liver. There is no
dilation of bile ducts. Gallbladder is distended, possibly due to
fasting state. There is no significant wall thickening in
gallbladder.

Pancreas: No focal abnormality is seen.

Spleen: There are coarse calcifications in the spleen with no
significant interval change.

Adrenals/Urinary Tract: Adrenals are unremarkable. There is no
hydronephrosis. There are no renal or ureteral stones. Urinary
bladder is unremarkable.

Stomach/Bowel: Stomach is not distended. Small bowel loops are not
dilated. Appendix is not dilated. There is contrast in the lumen of
appendix. There is no pericecal stranding. Scattered diverticula are
seen in the colon. There is interval decrease in wall thickening and
pericolic stranding in the sigmoid colon. There is a pigtail
percutaneous drainage catheter with its tip posterior to the sigmoid
colon. There is interval resolution of pericolic abscess posterior
to the sigmoid after placement of the percutaneous catheter. There
is new loculated fluid collection with thick wall adjacent to the
lateral margin of inferior descending colon measuring 3.2 x 2 cm
suggesting new pericolic abscess.

Vascular/Lymphatic: There are scattered arterial calcifications.

Reproductive: Unremarkable.

Other: There is no ascites or pneumoperitoneum. Small umbilical
hernia containing fat is seen. Small left inguinal hernia containing
fat is seen. There is moderate to large right inguinal hernia with
hernial sac measuring 6.3 x 5.4 cm. There is interval appearance of
stranding within the hernial sac. There is 2.8 x 2.5 cm loculated
thick-walled fluid collection in the right inguinal hernial sac.
Inflammatory stranding in the right inguinal hernia is extending to
the anterior margin of urinary bladder and to the sigmoid colon.

Musculoskeletal: Unremarkable.
IMPRESSION: There is moderate to large sized right inguinal hernia. There is
interval appearance of stranding in the fat planes within the right
inguinal hernia. There is interval appearance of 2.8 cm loculated
thick-walled fluid collection in the right inguinal hernial sac,
possibly an abscess. Possibility of extension of infectious process
from sigmoid diverticulitis into the right inguinal hernial sac is
not excluded.

There is interval clearing of pericolic abscess posterior to the
sigmoid colon after placement of percutaneous drainage catheter.
There is a new 3.2 cm pericolic abscess with thick wall lateral to
the inferior descending colon at the level left iliac crest.

There is no evidence of intestinal obstruction or pneumoperitoneum.
There is no hydronephrosis.

Small linear infiltrate in the posterior right lower lung fields
suggests subsegmental atelectasis. Fatty infiltration is seen in the
liver.

Other findings as described in the body of the report.

## 2024-04-07 IMAGING — CT CT ABD-PELV W/ CM
2 of 4 series · 14 of 46 positions shown, 16 images · IV contrast (agent unspecified)
Comparison: CT abdomen and pelvis 04/10/2022

CLINICAL DATA: Diverticulitis follow-up

EXAM:
CT ABDOMEN AND PELVIS WITH CONTRAST
TECHNIQUE: Multidetector CT imaging of the abdomen and pelvis was performed
using the standard protocol following bolus administration of
intravenous contrast.

[Series 2: abd pelvis 5.00 br40 s3 axial · axial · 0.71mm/px · z∈[+1156,+1586]mm · 11 of 104 slices shown, 13 images]
[im 9/104  soft-tissue]
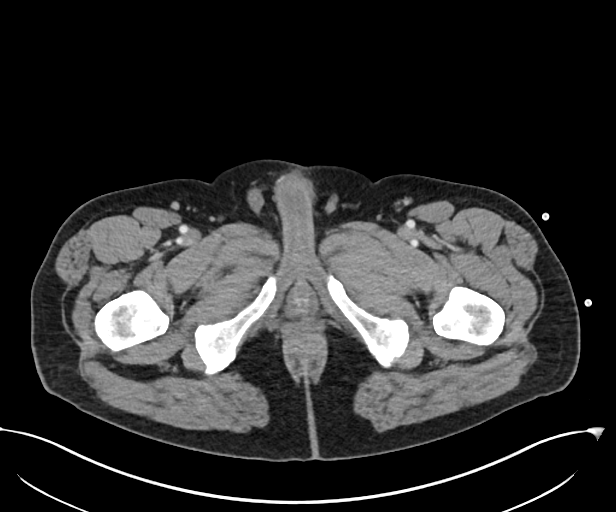
[im 9/104  bone]
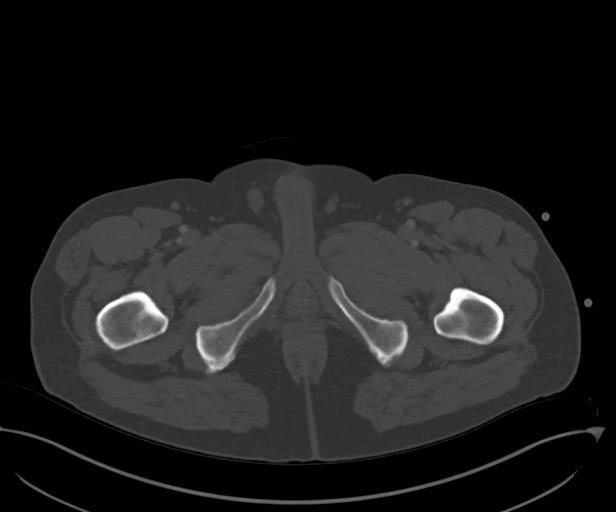
[im 17/104  soft-tissue]
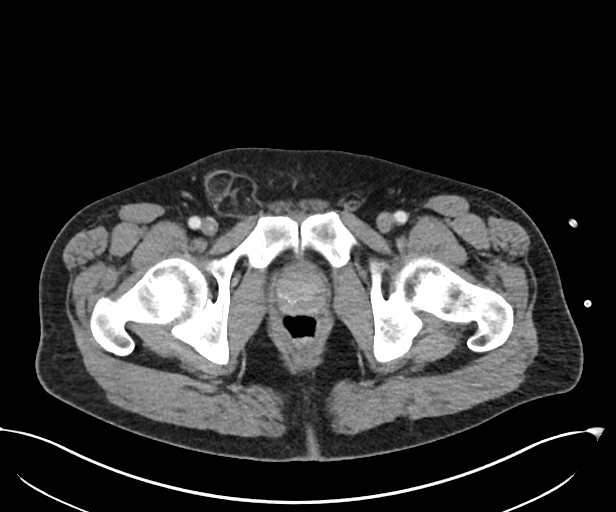
[im 25/104  soft-tissue]
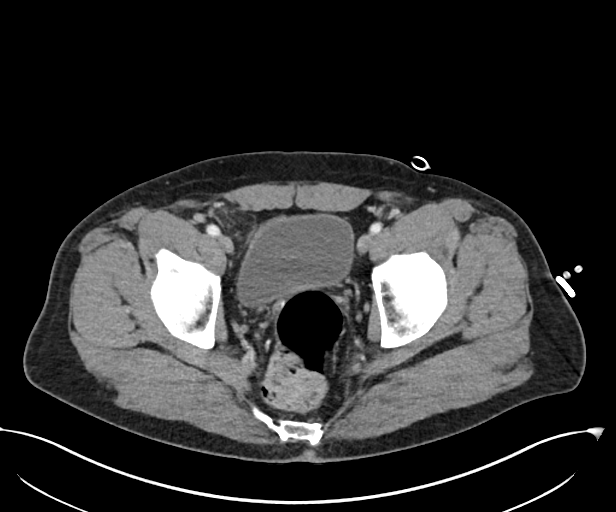
[im 33/104  soft-tissue]
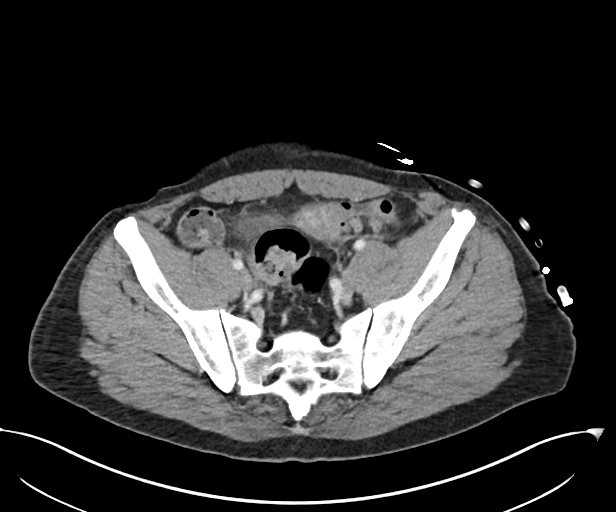
[im 42/104  soft-tissue]
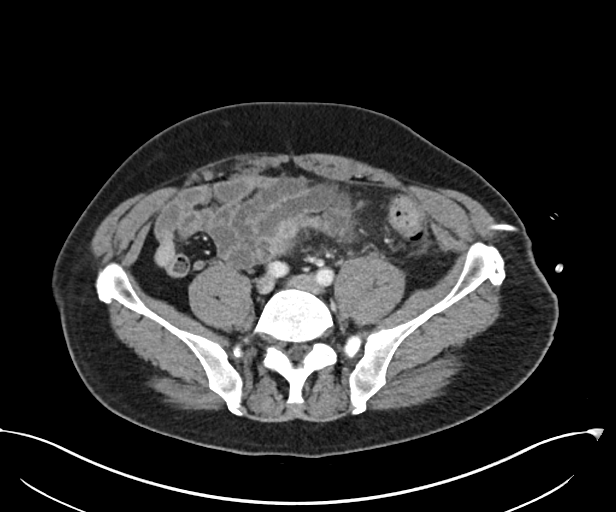
[im 54/104  soft-tissue]
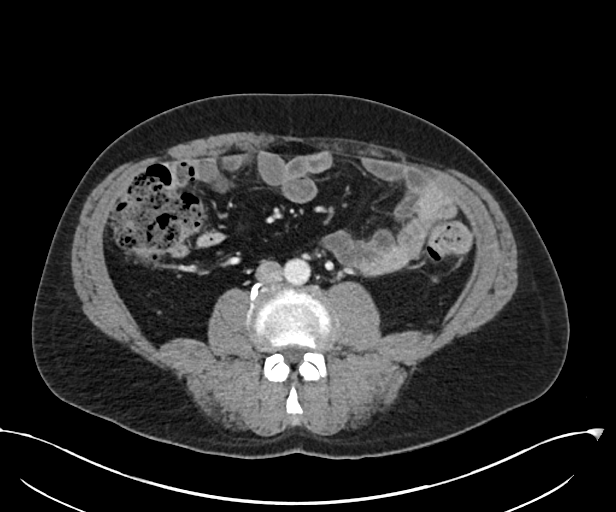
[im 62/104  soft-tissue]
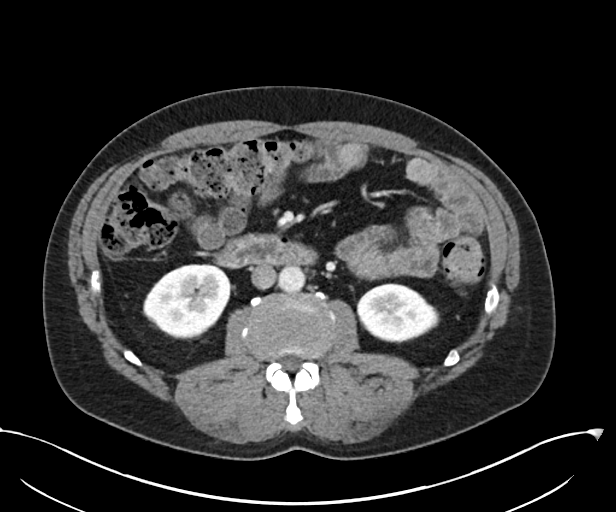
[im 71/104  soft-tissue]
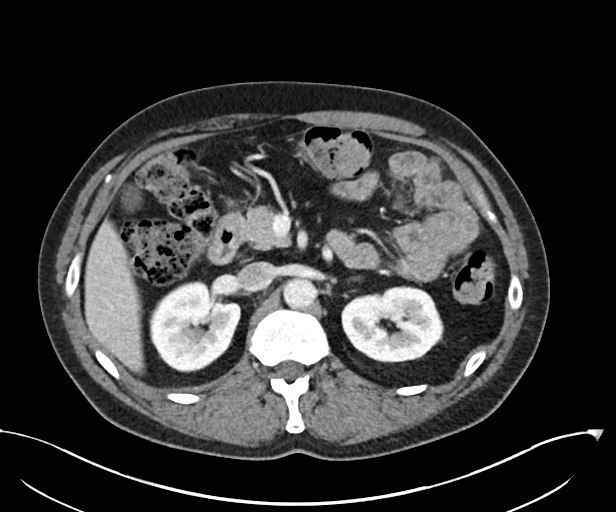
[im 79/104  soft-tissue]
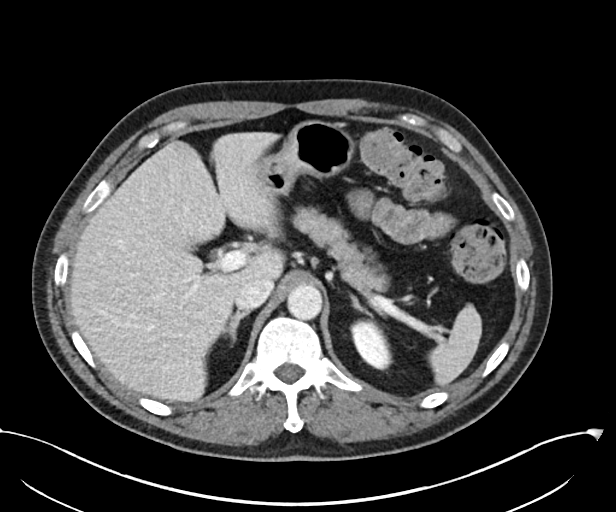
[im 79/104  bone]
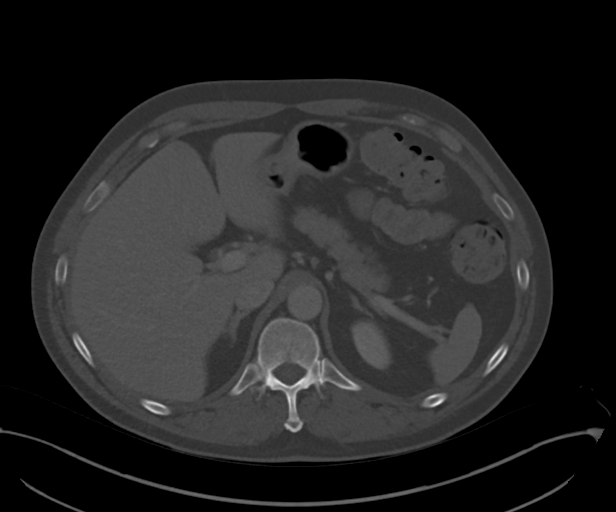
[im 87/104  soft-tissue]
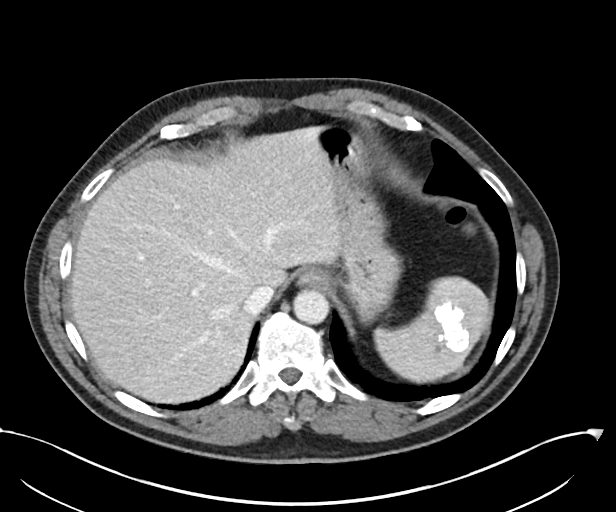
[im 95/104  soft-tissue]
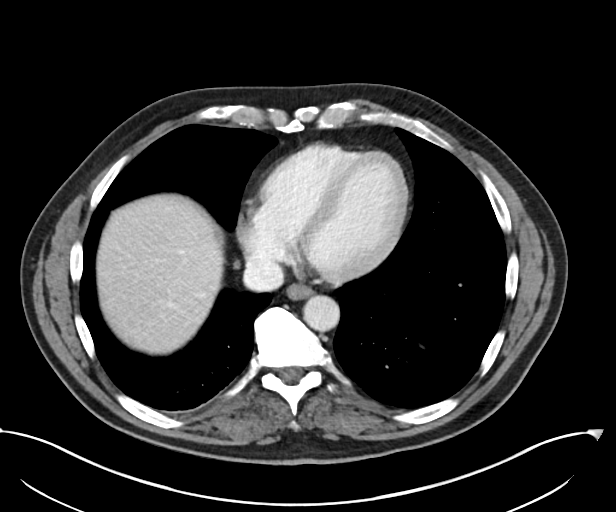

[Series 6: abd pelvis 2.00 br40 s3 cor · coronal · 0.82mm/px · 3 of 156 slices shown]
[im 52/156  soft-tissue]
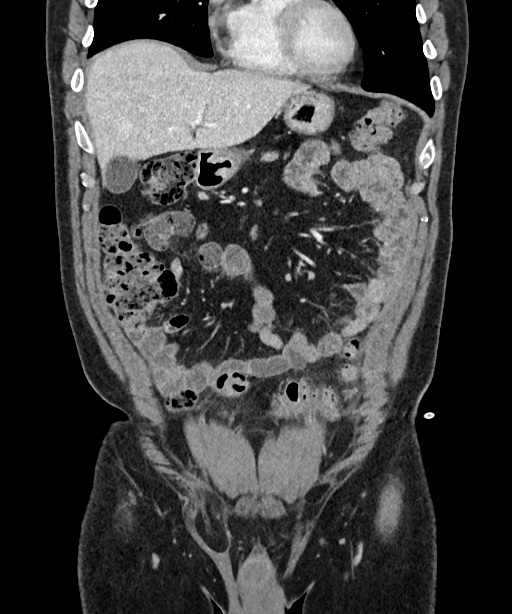
[im 69/156  soft-tissue]
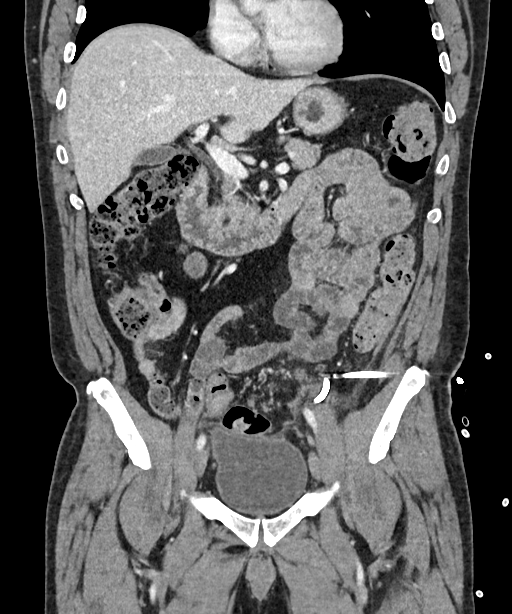
[im 87/156  soft-tissue]
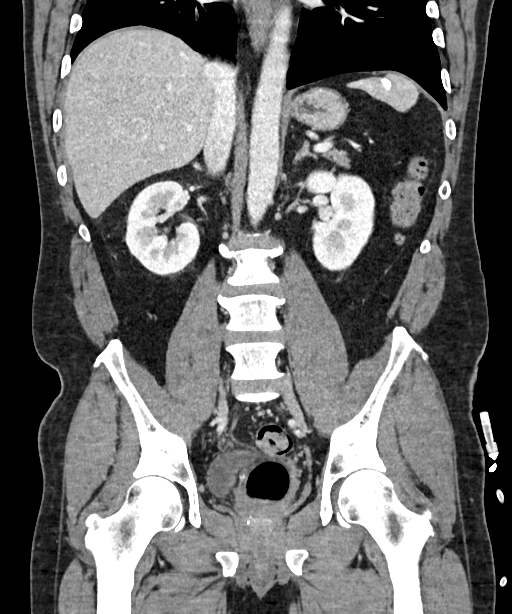

[14 of 46 positions shown; findings below may reference images not displayed]

RADIATION DOSE REDUCTION: This exam was performed according to the
departmental dose-optimization program which includes automated
exposure control, adjustment of the mA and/or kV according to
patient size and/or use of iterative reconstruction technique.

CONTRAST:  100mL [id number] IOPAMIDOL (SDLP1D-JVV) INJECTION 61%
FINDINGS: Lower chest: No acute abnormality.

Hepatobiliary: Liver is normal in size and contour with no
suspicious mass identified. Small area of focal fatty infiltration
adjacent to the falciform ligament. Gallbladder appears normal. No
biliary ductal dilatation identified.

Pancreas: Unremarkable. No pancreatic ductal dilatation or
surrounding inflammatory changes.

Spleen: Normal size with stable calcifications superiorly.

Adrenals/Urinary Tract: Adrenal glands appear normal. Kidneys are
normal. Urinary bladder appears normal.

Stomach/Bowel: No bowel obstruction, free air or pneumatosis. Mild
colonic diverticulosis. Mild wall thickening of the proximal sigmoid
colon which is somewhat improved since previous study. The
left-sided percutaneous pigtail drainage catheter is in stable
position with the tip just posterior to the proximal sigmoid colon,
with no abscess collection visualized. Moderate amount of retained
fecal material throughout the colon. Appendix is normal.

Vascular/Lymphatic: Aortic atherosclerosis. No enlarged abdominal or
pelvic lymph nodes.

Reproductive: Prostate is unremarkable.

Other: No ascites. Right inguinal hernia containing fat. The
previous peripherally enhancing collection in the hernia is no
longer visualized.

Musculoskeletal: No acute or significant osseous findings.
IMPRESSION: 1. Right inguinal hernia containing fat. The previous rim enhancing
collection within the hernia is no longer visualized.
2. Mild colonic diverticulosis and mild wall thickening of the
proximal sigmoid colon which is somewhat improved since previous
study. Adjacent left-sided percutaneous drainage catheter in stable
position with no fluid collection identified, correlate with
drainage output.
